# Patient Record
Sex: Male | Born: 1955
Health system: Southern US, Community
[De-identification: ages and names within clinical notes are randomized; demographics above are authoritative.]

## PROBLEM LIST (undated history)

## (undated) DIAGNOSIS — J45909 Unspecified asthma, uncomplicated: Secondary | ICD-10-CM

## (undated) DIAGNOSIS — S069X9A Unspecified intracranial injury with loss of consciousness of unspecified duration, initial encounter: Secondary | ICD-10-CM

## (undated) DIAGNOSIS — K759 Inflammatory liver disease, unspecified: Secondary | ICD-10-CM

## (undated) DIAGNOSIS — S42309A Unspecified fracture of shaft of humerus, unspecified arm, initial encounter for closed fracture: Secondary | ICD-10-CM

## (undated) DIAGNOSIS — M5136 Other intervertebral disc degeneration, lumbar region: Secondary | ICD-10-CM

## (undated) DIAGNOSIS — F1010 Alcohol abuse, uncomplicated: Secondary | ICD-10-CM

## (undated) DIAGNOSIS — M549 Dorsalgia, unspecified: Secondary | ICD-10-CM

## (undated) DIAGNOSIS — R519 Headache, unspecified: Secondary | ICD-10-CM

## (undated) DIAGNOSIS — I10 Essential (primary) hypertension: Secondary | ICD-10-CM

## (undated) DIAGNOSIS — R51 Headache: Secondary | ICD-10-CM

## (undated) HISTORY — PX: COLONOSCOPY: SHX174

---

## 2004-11-11 ENCOUNTER — Emergency Department (HOSPITAL_COMMUNITY): Admission: EM | Admit: 2004-11-11 | Discharge: 2004-11-11 | Payer: Self-pay | Admitting: Emergency Medicine

## 2004-12-25 ENCOUNTER — Emergency Department (HOSPITAL_COMMUNITY): Admission: EM | Admit: 2004-12-25 | Discharge: 2004-12-25 | Payer: Self-pay | Admitting: Emergency Medicine

## 2008-09-16 ENCOUNTER — Emergency Department (HOSPITAL_COMMUNITY): Admission: EM | Admit: 2008-09-16 | Discharge: 2008-09-16 | Payer: Self-pay | Admitting: Family Medicine

## 2009-09-06 DIAGNOSIS — S069XAA Unspecified intracranial injury with loss of consciousness status unknown, initial encounter: Secondary | ICD-10-CM

## 2009-09-06 DIAGNOSIS — S069X9A Unspecified intracranial injury with loss of consciousness of unspecified duration, initial encounter: Secondary | ICD-10-CM

## 2009-09-06 HISTORY — DX: Unspecified intracranial injury with loss of consciousness of unspecified duration, initial encounter: S06.9X9A

## 2009-09-06 HISTORY — DX: Unspecified intracranial injury with loss of consciousness status unknown, initial encounter: S06.9XAA

## 2010-04-22 ENCOUNTER — Encounter: Payer: Self-pay | Admitting: Cardiology

## 2010-05-17 ENCOUNTER — Inpatient Hospital Stay (HOSPITAL_COMMUNITY): Admission: EM | Admit: 2010-05-17 | Discharge: 2010-05-19 | Payer: Self-pay | Admitting: Emergency Medicine

## 2010-05-20 ENCOUNTER — Emergency Department (HOSPITAL_COMMUNITY)
Admission: EM | Admit: 2010-05-20 | Discharge: 2010-05-20 | Payer: Self-pay | Source: Home / Self Care | Admitting: Emergency Medicine

## 2010-05-28 ENCOUNTER — Encounter: Payer: Self-pay | Admitting: General Surgery

## 2010-05-28 ENCOUNTER — Emergency Department (HOSPITAL_COMMUNITY): Admission: EM | Admit: 2010-05-28 | Discharge: 2010-05-28 | Payer: Self-pay | Admitting: Emergency Medicine

## 2010-06-01 ENCOUNTER — Emergency Department (HOSPITAL_COMMUNITY): Admission: EM | Admit: 2010-06-01 | Discharge: 2010-06-01 | Payer: Self-pay | Admitting: Emergency Medicine

## 2010-10-06 NOTE — Letter (Signed)
Summary: [facility]  [facility]   Imported By: Marylou Mccoy 07/15/2010 14:57:07  _____________________________________________________________________  External Attachment:    Type:   Image     Comment:   External Document

## 2010-11-19 LAB — URINE MICROSCOPIC-ADD ON

## 2010-11-19 LAB — URINALYSIS, ROUTINE W REFLEX MICROSCOPIC
Bilirubin Urine: NEGATIVE
Glucose, UA: NEGATIVE mg/dL
Ketones, ur: NEGATIVE mg/dL
Leukocytes, UA: NEGATIVE
Nitrite: NEGATIVE
Specific Gravity, Urine: 1.015 (ref 1.005–1.030)
pH: 5.5 (ref 5.0–8.0)

## 2010-11-19 LAB — GLUCOSE, CAPILLARY: Glucose-Capillary: 153 mg/dL — ABNORMAL HIGH (ref 70–99)

## 2010-11-19 LAB — COMPREHENSIVE METABOLIC PANEL
ALT: 74 U/L — ABNORMAL HIGH (ref 0–53)
AST: 82 U/L — ABNORMAL HIGH (ref 0–37)
Alkaline Phosphatase: 58 U/L (ref 39–117)
CO2: 18 mEq/L — ABNORMAL LOW (ref 19–32)
GFR calc Af Amer: 56 mL/min — ABNORMAL LOW (ref >60)
GFR calc non Af Amer: 46 mL/min — ABNORMAL LOW (ref >60)
Glucose, Bld: 108 mg/dL — ABNORMAL HIGH (ref 70–99)
Potassium: 4.2 mEq/L (ref 3.5–5.1)
Sodium: 138 mEq/L (ref 135–145)

## 2010-11-19 LAB — BASIC METABOLIC PANEL
Calcium: 9.3 mg/dL (ref 8.4–10.5)
GFR calc Af Amer: >60 mL/min (ref >60)
GFR calc non Af Amer: 59 mL/min — ABNORMAL LOW (ref >60)
Glucose, Bld: 132 mg/dL — ABNORMAL HIGH (ref 70–99)
Potassium: 3.9 mEq/L (ref 3.5–5.1)
Sodium: 132 mEq/L — ABNORMAL LOW (ref 135–145)

## 2010-11-19 LAB — MRSA PCR SCREENING: MRSA by PCR: NEGATIVE

## 2010-11-19 LAB — CBC
HCT: 41.6 % (ref 39.0–52.0)
HCT: 41.8 % (ref 39.0–52.0)
Hemoglobin: 14.1 g/dL (ref 13.0–17.0)
Hemoglobin: 14.3 g/dL (ref 13.0–17.0)
MCHC: 34.2 g/dL (ref 30.0–36.0)
RBC: 5.52 MIL/uL (ref 4.22–5.81)
RBC: 5.53 MIL/uL (ref 4.22–5.81)
WBC: 8.7 10*3/uL (ref 4.0–10.5)
WBC: 9.6 10*3/uL (ref 4.0–10.5)

## 2010-11-19 LAB — POCT I-STAT, CHEM 8
BUN: 20 mg/dL (ref 6–23)
Calcium, Ion: 1.09 mmol/L — ABNORMAL LOW (ref 1.12–1.32)
Chloride: 108 mEq/L (ref 96–112)
HCT: 48 % (ref 39.0–52.0)
Potassium: 4.2 mEq/L (ref 3.5–5.1)

## 2010-11-19 LAB — PROTIME-INR: Prothrombin Time: 12.4 seconds (ref 11.6–15.2)

## 2010-11-19 LAB — RAPID URINE DRUG SCREEN, HOSP PERFORMED
Amphetamines: NOT DETECTED
Opiates: POSITIVE — AB
Tetrahydrocannabinol: NOT DETECTED

## 2010-11-19 LAB — ABO/RH: ABO/RH(D): A POS

## 2015-10-12 ENCOUNTER — Encounter: Payer: Self-pay | Admitting: Emergency Medicine

## 2015-10-12 ENCOUNTER — Emergency Department
Admission: EM | Admit: 2015-10-12 | Discharge: 2015-10-13 | Disposition: A | Discharge status: Home / Self Care | Payer: Medicaid Other | Attending: Emergency Medicine | Admitting: Emergency Medicine

## 2015-10-12 DIAGNOSIS — F1410 Cocaine abuse, uncomplicated: Secondary | ICD-10-CM | POA: Insufficient documentation

## 2015-10-12 DIAGNOSIS — M79661 Pain in right lower leg: Secondary | ICD-10-CM | POA: Diagnosis not present

## 2015-10-12 DIAGNOSIS — F1020 Alcohol dependence, uncomplicated: Secondary | ICD-10-CM | POA: Insufficient documentation

## 2015-10-12 DIAGNOSIS — I10 Essential (primary) hypertension: Secondary | ICD-10-CM | POA: Insufficient documentation

## 2015-10-12 DIAGNOSIS — F17200 Nicotine dependence, unspecified, uncomplicated: Secondary | ICD-10-CM | POA: Diagnosis not present

## 2015-10-12 DIAGNOSIS — M79662 Pain in left lower leg: Secondary | ICD-10-CM | POA: Insufficient documentation

## 2015-10-12 DIAGNOSIS — G8929 Other chronic pain: Secondary | ICD-10-CM | POA: Diagnosis not present

## 2015-10-12 DIAGNOSIS — Z008 Encounter for other general examination: Secondary | ICD-10-CM | POA: Diagnosis present

## 2015-10-12 HISTORY — DX: Essential (primary) hypertension: I10

## 2015-10-12 HISTORY — DX: Inflammatory liver disease, unspecified: K75.9

## 2015-10-12 LAB — ETHANOL: Alcohol, Ethyl (B): <5 mg/dL (ref <5)

## 2015-10-12 LAB — BASIC METABOLIC PANEL
ANION GAP: 7 (ref 5–15)
BUN: 27 mg/dL — ABNORMAL HIGH (ref 6–20)
CALCIUM: 8.7 mg/dL — AB (ref 8.9–10.3)
CHLORIDE: 107 mmol/L (ref 101–111)
CO2: 23 mmol/L (ref 22–32)
Creatinine, Ser: 1.75 mg/dL — ABNORMAL HIGH (ref 0.61–1.24)
GFR calc non Af Amer: 41 mL/min — ABNORMAL LOW (ref >60)
GFR, EST AFRICAN AMERICAN: 48 mL/min — AB (ref >60)
GLUCOSE: 93 mg/dL (ref 65–99)
POTASSIUM: 4.2 mmol/L (ref 3.5–5.1)
Sodium: 137 mmol/L (ref 135–145)

## 2015-10-12 LAB — CBC
HEMATOCRIT: 47.3 % (ref 40.0–52.0)
HEMOGLOBIN: 15 g/dL (ref 13.0–18.0)
MCH: 24.2 pg — ABNORMAL LOW (ref 26.0–34.0)
MCHC: 31.7 g/dL — AB (ref 32.0–36.0)
MCV: 76.4 fL — ABNORMAL LOW (ref 80.0–100.0)
Platelets: 248 10*3/uL (ref 150–440)
RBC: 6.18 MIL/uL — AB (ref 4.40–5.90)
RDW: 14.7 % — ABNORMAL HIGH (ref 11.5–14.5)
WBC: 7.6 10*3/uL (ref 3.8–10.6)

## 2015-10-12 LAB — COMPREHENSIVE METABOLIC PANEL
ALBUMIN: 4.1 g/dL (ref 3.5–5.0)
ALK PHOS: 61 U/L (ref 38–126)
ALT: 60 U/L (ref 17–63)
ANION GAP: 8 (ref 5–15)
AST: 76 U/L — ABNORMAL HIGH (ref 15–41)
BILIRUBIN TOTAL: 1 mg/dL (ref 0.3–1.2)
BUN: 26 mg/dL — ABNORMAL HIGH (ref 6–20)
CALCIUM: 9.7 mg/dL (ref 8.9–10.3)
CO2: 25 mmol/L (ref 22–32)
Chloride: 102 mmol/L (ref 101–111)
Creatinine, Ser: 1.93 mg/dL — ABNORMAL HIGH (ref 0.61–1.24)
GFR calc non Af Amer: 37 mL/min — ABNORMAL LOW (ref >60)
GFR, EST AFRICAN AMERICAN: 42 mL/min — AB (ref >60)
GLUCOSE: 146 mg/dL — AB (ref 65–99)
POTASSIUM: 4 mmol/L (ref 3.5–5.1)
SODIUM: 135 mmol/L (ref 135–145)
TOTAL PROTEIN: 8.1 g/dL (ref 6.5–8.1)

## 2015-10-12 LAB — URINE DRUG SCREEN, QUALITATIVE (ARMC ONLY)
AMPHETAMINES, UR SCREEN: NOT DETECTED
BARBITURATES, UR SCREEN: NOT DETECTED
Benzodiazepine, Ur Scrn: NOT DETECTED
COCAINE METABOLITE, UR ~~LOC~~: POSITIVE — AB
Cannabinoid 50 Ng, Ur ~~LOC~~: NOT DETECTED
MDMA (ECSTASY) UR SCREEN: NOT DETECTED
METHADONE SCREEN, URINE: NOT DETECTED
OPIATE, UR SCREEN: NOT DETECTED
Phencyclidine (PCP) Ur S: NOT DETECTED
TRICYCLIC, UR SCREEN: NOT DETECTED

## 2015-10-12 MED ORDER — SODIUM CHLORIDE 0.9 % IV SOLN
Freq: Once | INTRAVENOUS | Status: AC
  Administered 2015-10-12: 1000 mL via INTRAVENOUS

## 2015-10-12 NOTE — ED Notes (Signed)
Pt given water to help facilitate getting a urine sample. Pt last admitted for detox thru [facility] 6-7 years ago.

## 2015-10-12 NOTE — ED Notes (Signed)
Pt waiting for [facility] transport; calm and cooperative

## 2015-10-12 NOTE — [facility] Counselor (Signed)
This Clinical research associate faxed pt's referral information to [facility] for potential placement. Awaiting for approval from [facility].

## 2015-10-12 NOTE — ED Notes (Signed)
Labs drawn and sent -

## 2015-10-12 NOTE — ED Notes (Signed)
First liter of IVF complete; pt up to BR with steady gait to void;

## 2015-10-12 NOTE — ED Provider Notes (Signed)
Patient waiting for bed for [facility] patient now has a bed in the meantime is given the patient 2 L of fluid in attempt to worsening renal function because I thought perhaps it was due to dehydration his renal function has improved somewhat. [facility] now has a bed I will discharge and [facility] is planned  Arnaldo Natal, MD 10/12/15 2219

## 2015-10-12 NOTE — ED Notes (Signed)
Explained to pt we are still waiting to see if [facility] has accepted him. Pt watching tv with no complaints

## 2015-10-12 NOTE — Discharge Instructions (Signed)
Alcohol Use Disorder °Alcohol use disorder is a mental disorder. It is not a one-time incident of heavy drinking. Alcohol use disorder is the excessive and uncontrollable use of alcohol over time that leads to problems with functioning in one or more areas of daily living. People with this disorder risk harming themselves and others when they drink to excess. Alcohol use disorder also can cause other mental disorders, such as mood and anxiety disorders, and serious physical problems. People with alcohol use disorder often misuse other drugs.  °Alcohol use disorder is common and widespread. Some people with this disorder drink alcohol to cope with or escape from negative life events. Others drink to relieve chronic pain or symptoms of mental illness. People with a family history of alcohol use disorder are at higher risk of losing control and using alcohol to excess.  °Drinking too much alcohol can cause injury, accidents, and health problems. One drink can be too much when you are: °· Working. °· Pregnant or breastfeeding. °· Taking medicines. Ask your doctor. °· Driving or planning to drive. °SYMPTOMS  °Signs and symptoms of alcohol use disorder may include the following:  °· Consumption of alcohol in larger amounts or over a longer period of time than intended. °· Multiple unsuccessful attempts to cut down or control alcohol use.   °· A great deal of time spent obtaining alcohol, using alcohol, or recovering from the effects of alcohol (hangover). °· A strong desire or urge to use alcohol (cravings).   °· Continued use of alcohol despite problems at work, school, or home because of alcohol use.   °· Continued use of alcohol despite problems in relationships because of alcohol use. °· Continued use of alcohol in situations when it is physically hazardous, such as driving a car. °· Continued use of alcohol despite awareness of a physical or psychological problem that is likely related to alcohol use. Physical  problems related to alcohol use can involve the brain, heart, liver, stomach, and intestines. Psychological problems related to alcohol use include intoxication, depression, anxiety, psychosis, delirium, and dementia.   °· The need for increased amounts of alcohol to achieve the same desired effect, or a decreased effect from the consumption of the same amount of alcohol (tolerance). °· Withdrawal symptoms upon reducing or stopping alcohol use, or alcohol use to reduce or avoid withdrawal symptoms. Withdrawal symptoms include: °¨ Racing heart. °¨ Hand tremor. °¨ Difficulty sleeping. °¨ Nausea. °¨ Vomiting. °¨ Hallucinations. °¨ Restlessness. °¨ Seizures. °DIAGNOSIS °Alcohol use disorder is diagnosed through an assessment by your health care provider. Your health care provider may start by asking three or four questions to screen for excessive or problematic alcohol use. To confirm a diagnosis of alcohol use disorder, at least two symptoms must be present within a 12-month period. The severity of alcohol use disorder depends on the number of symptoms: °· Mild--two or three. °· Moderate--four or five. °· Severe--six or more. °Your health care provider may perform a physical exam or use results from lab tests to see if you have physical problems resulting from alcohol use. Your health care provider may refer you to a mental health professional for evaluation. °TREATMENT  °Some people with alcohol use disorder are able to reduce their alcohol use to low-risk levels. Some people with alcohol use disorder need to quit drinking alcohol. When necessary, mental health professionals with specialized training in substance use treatment can help. Your health care provider can help you decide how severe your alcohol use disorder is and what type of treatment you need.   The following forms of treatment are available:   Detoxification. Detoxification involves the use of prescription medicines to prevent alcohol withdrawal  symptoms in the first week after quitting. This is important for people with a history of symptoms of withdrawal and for heavy drinkers who are likely to have withdrawal symptoms. Alcohol withdrawal can be dangerous and, in severe cases, cause death. Detoxification is usually provided in a hospital or in-patient substance use treatment facility.  Counseling or talk therapy. Talk therapy is provided by substance use treatment counselors. It addresses the reasons people use alcohol and ways to keep them from drinking again. The goals of talk therapy are to help people with alcohol use disorder find healthy activities and ways to cope with life stress, to identify and avoid triggers for alcohol use, and to handle cravings, which can cause relapse.  Medicines.Different medicines can help treat alcohol use disorder through the following actions:  Decrease alcohol cravings.  Decrease the positive reward response felt from alcohol use.  Produce an uncomfortable physical reaction when alcohol is used (aversion therapy).  Support groups. Support groups are run by people who have quit drinking. They provide emotional support, advice, and guidance. These forms of treatment are often combined. Some people with alcohol use disorder benefit from intensive combination treatment provided by specialized substance use treatment centers. Both inpatient and outpatient treatment programs are available.   This information is not intended to replace advice given to you by your health care provider. Make sure you discuss any questions you have with your health care provider.   Document Released: 09/30/2004 Document Revised: 09/13/2014 Document Reviewed: 11/30/2012 Elsevier Interactive Patient Education 2016 [employer].  Finding Treatment for Addiction WHAT IS ADDICTION? Addiction is a complex disease of the brain. It causes an uncontrollable (compulsive) need for a substance. You can be addicted to alcohol,  illegal drugs, or prescription medicines such as painkillers. Addiction can also be a behavior, like gambling or shopping. The need for the drug or activity can become so strong that you think about it all the time. You can also become physically dependent on a substance. Addiction can change the way your brain works. Because of these changes, getting more of whatever you are addicted to becomes the most important thing to you and feels better than other activities or relationships. Addiction can lead to changes in health, behavior, emotions, relationships, and choices that affect you and everyone around you. HOW DO I KNOW IF I NEED TREATMENT FOR ADDICTION? Addiction is a progressive disease. Without treatment, addiction can get worse. Living with addiction puts you at higher risk for injury, poor health, lost employment, loss of money, and even death. You might need treatment for addiction if:  You have tried to stop or cut down, but you cannot.  Your addiction is causing physical health problems.  You find it annoying that your friends and family are concerned about your alcohol or substance use.  You feel guilty about substance abuse or a compulsive behavior.  You have lied or tried to hide your addiction.  You need a particular substance or activity to start your day or to calm down.  You are getting in trouble at school, work, home, or with the police.  You have done something illegal to support your addiction.  You are running out of money because of your addiction.  You have no time for anything other than your addiction. WHAT TYPES OF TREATMENT ARE AVAILABLE? The treatment program that is right for you  will depend on many factors, including the type of addiction you have. Treatment programs can be outpatient or inpatient. In an outpatient program, you live at home and go to work or school, but you also go to a clinic for treatment. With an inpatient program, you live and sleep at  the program facility during treatment. After treatment, you might need a plan for support during recovery. Other treatment options include:   Medicine.  Some addictions may be treated with prescription medicines.  You might also need medicine to treat anxiety or depression.  Counseling and behavior therapy. Therapy can help individuals and families behave in healthier ways and relate more effectively.  Support groups. Confidential group therapy, such as a 12-step program, can help individuals and families during treatment and recovery. No single type of program is right for everyone. Many treatment programs involve a combination of education, counseling, and a 12-step, spiritually-based approach. Some treatment programs are government sponsored. They are geared for patients who do not have private insurance. Treatment programs can vary in many respects, such as:  Cost and types of insurance that are accepted.  Types of on-site medical services that are offered.  Length of stay, setting, and size.  Overall philosophy of treatment. WHAT SHOULD I CONSIDER WHEN SELECTING A TREATMENT PROGRAM? It is important to think about your individual requirements when selecting a treatment program. There are a number of things to consider, such as:  If the program is certified by the appropriate government agency. Even private programs must be certified and employ certified professionals.  If the program is covered by your insurance. If finances are a concern, the first call you should make is to your insurance company, if you have health insurance. Ask for a list of treatment programs that are in your network, and confirm any copayments and deductibles that you may have to pay.  If you do not have insurance, or if you choose to attend a program that does not accept your insurance, discuss whether a payment plan can be set up.  If treatment is available in languages other than English, if needed.  If  the program offers detoxification treatment, if needed.  If 12-step meetings are held at the center or if transport is available for patients to attend meetings at other locations.  If the program is professional, organized, and clean.  If the program meets all of your needs, including physical and cultural needs.  If the facility offers specific treatment for your particular addiction.  If support continues to be offered after you have left the program.  If your treatment plan is continually looked at to make sure you are receiving the right treatment at the right time.  If mental health counseling is part of your treatment.  If medicine is included in treatment, if needed.  If your family is included in your treatment plan and if support is offered to them throughout the treatment process.  How the treatment works to prevent relapse. WHERE ELSE CAN I GET HELP?  Your health care provider. Ask him or her to help you find addiction treatment. These discussions are confidential.  The [employer] on Alcoholism and Drug Dependence (NCADD). This group has information about treatment centers and programs for people who have an addiction and for family members.  The telephone number is 1-800-NCA-CALL ((281)445-8389).  The website is https://ncadd.org/about-ncadd/our-affiliates  The Substance Abuse and Mental Health Services Administration [facility]). This group will help you find publicly funded treatment centers, help hotlines,  and counseling services near you.  The telephone number is 1-800-662-HELP (234-705-7634).  The website is www.findtreatment.RockT[employer].pl In countries outside of the [place]. and [place], look in [employer] for contact information for services in your area.   This information is not intended to replace advice given to you by your health care provider. Make sure you discuss any questions you have with your health care provider.   Document Released:  07/22/2005 Document Revised: 05/14/2015 Document Reviewed: 06/11/2014 Elsevier Interactive Patient Education 2016 Elsevier Inc.  Stimulant Use Disorder-Cocaine Cocaine is one of a group of powerful drugs called stimulants. Cocaine has medical uses for stopping nosebleeds and for pain control before minor nose or dental surgery. However, cocaine is misused because of the effects that it produces. These effects include:   A feeling of extreme pleasure.  Alertness.  High energy. Common street names for cocaine include coke, crack, blow, snow, and nose candy. Cocaine is snorted, dissolved in water and injected, or smoked.  Stimulants are addictive because they activate regions of the brain that produce both the pleasurable sensation of "reward" and psychological dependence. Together, these actions account for loss of control and the rapid development of drug dependence. This means you become ill without the drug (withdrawal) and need to keep using it to function.  Stimulant use disorder is use of stimulants that disrupts your daily life. It disrupts relationships with family and friends and how you do your job. Cocaine increases your blood pressure and heart rate. It can cause a heart attack or stroke. Cocaine can also cause death from irregular heart rate or seizures. SYMPTOMS Symptoms of stimulant use disorder with cocaine include:  Use of cocaine in larger amounts or over a longer period of time than intended.  Unsuccessful attempts to cut down or control cocaine use.  A lot of time spent obtaining, using, or recovering from the effects of cocaine.  A strong desire or urge to use cocaine (craving).  Continued use of cocaine in spite of major problems at work, school, or home because of use.  Continued use of cocaine in spite of relationship problems because of use.  Giving up or cutting down on important life activities because of cocaine use.  Use of cocaine over and over in  situations when it is physically hazardous, such as driving a car.  Continued use of cocaine in spite of a physical problem that is likely related to use. Physical problems can include:  Malnutrition.  Nosebleeds.  Chest pain.  High blood pressure.  A hole that develops between the part of your nose that separates your nostrils (perforated nasal septum).  Lung and kidney damage.  Continued use of cocaine in spite of a mental problem that is likely related to use. Mental problems can include:  Schizophrenia-like symptoms.  Depression.  Bipolar mood swings.  Anxiety.  Sleep problems.  Need to use more and more cocaine to get the same effect, or lessened effect over time with use of the same amount of cocaine (tolerance).  Having withdrawal symptoms when cocaine use is stopped, or using cocaine to reduce or avoid withdrawal symptoms. Withdrawal symptoms include:  Depressed or irritable mood.  Low energy or restlessness.  Bad dreams.  Poor or excessive sleep.  Increased appetite. DIAGNOSIS Stimulant use disorder is diagnosed by your health care provider. You may be asked questions about your cocaine use and how it affects your life. A physical exam may be done. A drug screen may be ordered. You may be  referred to a mental health professional. The diagnosis of stimulant use disorder requires at least two symptoms within 12 months. The type of stimulant use disorder depends on the number of signs and symptoms you have. The type may be:  Mild. Two or three signs and symptoms.  Moderate. Four or five signs and symptoms.  Severe. Six or more signs and symptoms. TREATMENT Treatment for stimulant use disorder is usually provided by mental health professionals with training in substance use disorders. The following options are available:  Counseling or talk therapy. Talk therapy addresses the reasons you use cocaine and ways to keep you from using again. Goals of talk therapy  include:  Identifying and avoiding triggers for use.  Handling cravings.  Replacing use with healthy activities.  Support groups. Support groups provide emotional support, advice, and guidance.  Medicine. Certain medicines may decrease cocaine cravings or withdrawal symptoms. HOME CARE INSTRUCTIONS  Take medicines only as directed by your health care provider.  Identify the people and activities that trigger your cocaine use and avoid them.  Keep all follow-up visits as directed by your health care provider. SEEK MEDICAL CARE IF:  Your symptoms get worse or you relapse.  You are not able to take medicines as directed. SEEK IMMEDIATE MEDICAL CARE IF:  You have serious thoughts about hurting yourself or others.  You have a seizure, chest pain, sudden weakness, or loss of speech or vision. FOR MORE INFORMATION  National Institute on Drug Abuse: http://www.price-smith.com/  Substance Abuse and Mental Health Services Administration: Skate[employer]asis.com.pt   This information is not intended to replace advice given to you by your health care provider. Make sure you discuss any questions you have with your health care provider.   Document Released: 08/20/2000 Document Revised: 09/13/2014 Document Reviewed: 09/05/2013 Elsevier Interactive Patient Education [employer]

## 2015-10-12 NOTE — ED Notes (Signed)
Pt presents to triage ambulatory reports he wants DETOX from cocaine, alcohol. Last time cocaine consumption this morning, alcohol last drink this morning around 09:30am. Pt reports he is homeless and wants Rehab.

## 2015-10-12 NOTE — ED Provider Notes (Signed)
[facility] Emergency Department Provider Note  ____________________________________________  Time seen: 2:25 PM  I have reviewed the triage vital signs and the nursing notes.   HISTORY  Chief Complaint Medical Clearance    HPI Brian Kelley is a 60 y.o. male who requests detox and rehabilitation placement for alcohol and crack cocaine abuse. He last was in detox about 6 or 7 years ago at [facility]. Last use of alcohol was today. Reports getting shaky when he doesn't drink but no history of seizures or hallucinations. Last crack cocaine use was also today this morning. Denies any chest pain shortness of breath headache vision changes or other neuro or vascular symptoms.     Past Medical History  Diagnosis Date  . Hypertension   . Hepatitis      There are no active problems to display for this patient.    History reviewed. No pertinent past surgical history.   No current outpatient prescriptions on file.   Allergies Peanuts   No family history on file.  Social History Social History  Substance Use Topics  . Smoking status: Current Every Day Smoker -- 1.00 packs/day  . Smokeless tobacco: None  . Alcohol Use: Yes     Comment: 1/4    Review of Systems  Constitutional:   No fever or chills. No weight changes Eyes:   No blurry vision or double vision.  ENT:   No sore throat. Cardiovascular:   No chest pain. Respiratory:   No dyspnea or cough. Gastrointestinal:   Negative for abdominal pain, vomiting and diarrhea.  No BRBPR or melena. Genitourinary:   Negative for dysuria, urinary retention, bloody urine, or difficulty urinating. Musculoskeletal:   Negative for back pain. Chronic bilateral lower leg pain Skin:   Negative for rash. Neurological:   Negative for headaches, focal weakness or numbness. Psychiatric:  No anxiety or depression.   Endocrine:  No hot/cold intolerance, changes in energy, or sleep difficulty.  10-point ROS  otherwise negative.  ____________________________________________   PHYSICAL EXAM:  VITAL SIGNS: ED Triage Vitals  Enc Vitals Group     BP 10/12/15 1401 121/82 mmHg     Pulse Rate 10/12/15 1401 95     Resp --      Temp 10/12/15 1401 98.2 F (36.8 C)     Temp Source 10/12/15 1401 Oral     SpO2 10/12/15 1401 96 %     Weight 10/12/15 1401 160 lb (72.576 kg)     Height 10/12/15 1401  (1.626 m)     Head Cir --      Peak Flow --      Pain Score 10/12/15 1403 8     Pain Loc --      Pain Edu? --      Excl. in GC? --     Vital signs reviewed, nursing assessments reviewed.   Constitutional:   Alert and oriented. Well appearing and in no distress. Eyes:   No scleral icterus. No conjunctival pallor. PERRL. EOMI ENT   Head:   Normocephalic and atraumatic.   Nose:   No congestion/rhinnorhea. No septal hematoma   Mouth/Throat:   MMM, no pharyngeal erythema. No peritonsillar mass. No uvula shift.   Neck:   No stridor. No SubQ emphysema. No meningismus. Hematological/Lymphatic/Immunilogical:   No cervical lymphadenopathy. Cardiovascular:   RRR. Normal and symmetric distal pulses are present in all extremities. No murmurs, rubs, or gallops. Respiratory:   Normal respiratory effort without tachypnea nor retractions. Breath  sounds are clear and equal bilaterally. No wheezes/rales/rhonchi. Gastrointestinal:   Soft and nontender. No distention. There is no CVA tenderness.  No rebound, rigidity, or guarding. Genitourinary:   deferred Musculoskeletal:   Nontender with normal range of motion in all extremities. No joint effusions.  No lower extremity tenderness.  No edema. Neurologic:   Normal speech and language.  CN 2-10 normal. Motor grossly intact. No pronator drift.  Normal gait. No gross focal neurologic deficits are appreciated.  Skin:    Skin is warm, dry and intact. No rash noted.  No petechiae, purpura, or bullae. Psychiatric:   Mood and affect are normal. Speech  and behavior are normal. Patient exhibits appropriate insight and judgment.  ____________________________________________    LABS (pertinent positives/negatives) (all labs ordered are listed, but only abnormal results are displayed) Labs Reviewed  COMPREHENSIVE METABOLIC PANEL - Abnormal; Notable for the following:    Glucose, Bld 146 (*)    BUN 26 (*)    Creatinine, Ser 1.93 (*)    AST 76 (*)    GFR calc non Af Amer 37 (*)    GFR calc Af Amer 42 (*)    All other components within normal limits  CBC - Abnormal; Notable for the following:    RBC 6.18 (*)    MCV 76.4 (*)    MCH 24.2 (*)    MCHC 31.7 (*)    RDW 14.7 (*)    All other components within normal limits  ETHANOL  URINE DRUG SCREEN, QUALITATIVE ([facility] ONLY)   ____________________________________________   EKG    ____________________________________________    RADIOLOGY    ____________________________________________   PROCEDURES   ____________________________________________   INITIAL IMPRESSION / ASSESSMENT AND PLAN / ED COURSE  Pertinent labs & imaging results that were available during my care of the patient were reviewed by me and considered in my medical decision making (see chart for details).  Patient is well-appearing no acute distress, at apparently baseline, medically stable and cleared to proceed with rehabilitation placement. The patient does not appear to qualify for medically supervised with detox program at this time, so we'll refer him to [facility] which he can make contact with.  He is otherwise stable and suitable for discharge. No SI HI or hallucinations.     ____________________________________________   FINAL CLINICAL IMPRESSION(S) / ED DIAGNOSES  Final diagnoses:  Uncomplicated alcohol dependence (HCC)  Cocaine abuse      Sharman Cheek, MD 10/12/15 [phone]

## 2015-10-12 NOTE — ED Notes (Signed)
Pt resting in bed watching tv; understands waiting for call back from [employer] regarding acceptance or denial; no complaints or requests

## 2015-10-13 NOTE — ED Notes (Signed)
Pt waiting patiently for [facility] transport; spoke with Deanna Artis, TTS who says she was told transport would arrive around midnight; pt aware;

## 2015-11-24 ENCOUNTER — Emergency Department
Admission: EM | Admit: 2015-11-24 | Discharge: 2015-11-24 | Disposition: A | Discharge status: Home / Self Care | Payer: Medicaid Other | Attending: Emergency Medicine | Admitting: Emergency Medicine

## 2015-11-24 ENCOUNTER — Encounter: Payer: Self-pay | Admitting: Emergency Medicine

## 2015-11-24 DIAGNOSIS — G8929 Other chronic pain: Secondary | ICD-10-CM

## 2015-11-24 DIAGNOSIS — F17200 Nicotine dependence, unspecified, uncomplicated: Secondary | ICD-10-CM | POA: Insufficient documentation

## 2015-11-24 DIAGNOSIS — M545 Low back pain: Secondary | ICD-10-CM | POA: Insufficient documentation

## 2015-11-24 DIAGNOSIS — I10 Essential (primary) hypertension: Secondary | ICD-10-CM | POA: Diagnosis not present

## 2015-11-24 DIAGNOSIS — M25562 Pain in left knee: Secondary | ICD-10-CM | POA: Diagnosis present

## 2015-11-24 NOTE — ED Notes (Addendum)
Pt discharged home after verbalizing understanding of discharge instructions; nad noted. 

## 2015-11-24 NOTE — ED Provider Notes (Signed)
[facility] Emergency Department Provider Note  ____________________________________________  Time seen: Approximately 11:02 AM  I have reviewed the triage vital signs and the nursing notes.   HISTORY  Chief Complaint Knee Pain    HPI Brian Kelley is a 60 y.o. male , NAD, presents to the emergency department with chronic lower back and left leg pain and burning. States he recently was approved for [facility] and [facility] and is looking for a primary care physician. He states that he has applied for disability 3 times and was turned down and just recently reapplied. He is here today to establish care so that he can continue with his disability application. Patient denies any changes in his pain. Has not had any numbness, weakness, tingling. Has not had any recent injuries, falls, traumas. Denies any swelling or redness. No skin sores. Had no loss of bowel or bladder function and no saddle paresthesias.   Past Medical History  Diagnosis Date  . Hypertension   . Hepatitis     There are no active problems to display for this patient.   History reviewed. No pertinent past surgical history.  No current outpatient prescriptions on file.  Allergies Peanuts  History reviewed. No pertinent family history.  Social History Social History  Substance Use Topics  . Smoking status: Current Every Day Smoker -- 1.00 packs/day  . Smokeless tobacco: None  . Alcohol Use: Yes     Comment: 1/4     Review of Systems  Constitutional: No fever/chills, sweats Cardiovascular: No chest pain. Respiratory: No cough. No shortness of breath. No wheezing.  Gastrointestinal: No abdominal pain.  No nausea, vomiting.   Musculoskeletal: Positive for back pain, left hip and leg pain.  Skin: Negative for rash him a redness, swelling, skin sores. Neurological: Negative for headaches, focal weakness or numbness. No tingling or saddle paresthesias. 10-point ROS otherwise  negative.  ____________________________________________   PHYSICAL EXAM:  VITAL SIGNS: ED Triage Vitals  Enc Vitals Group     BP 11/24/15 1037 148/106 mmHg     Pulse Rate 11/24/15 1037 89     Resp 11/24/15 1037 19     Temp 11/24/15 1037 98.6 F (37 C)     Temp Source 11/24/15 1037 Oral     SpO2 11/24/15 1037 98 %     Weight 11/24/15 1037 180 lb (81.647 kg)     Height 11/24/15 1037  (1.753 m)     Head Cir --      Peak Flow --      Pain Score --      Pain Loc --      Pain Edu? --      Excl. in GC? --     Constitutional: Alert and oriented. Well appearing and in no acute distress. Eyes: Conjunctivae are normal. Head: Atraumatic.  Neck: Supple with FROM.  Hematological/Lymphatic/Immunilogical: No cervical lymphadenopathy. Cardiovascular:  Good peripheral circulation. Respiratory: Normal respiratory effort without tachypnea or retractions.  Musculoskeletal: No lower extremity tenderness nor edema.  No joint effusions. Neurologic:  Normal speech and language. No gross focal neurologic deficits are appreciated.  Skin:  Skin is warm, dry and intact. No rash noted. Psychiatric: Mood and affect are normal. Speech and behavior are normal. Patient exhibits appropriate insight and judgement.   ____________________________________________   LABS  None  ____________________________________________  EKG  None ____________________________________________  RADIOLOGY  None ____________________________________________    PROCEDURES  Procedure(s) performed: None    Medications - No data to  display   ____________________________________________   INITIAL IMPRESSION / ASSESSMENT AND PLAN / ED COURSE  Patient's diagnosis is consistent with chronic pain. Patient only presented to the ED to gain referral to a new PCP in the area to establish care. Patient is to follow up with [facility] to establish care. Patient is given ED precautions to return to the  ED for any worsening or new symptoms.    ____________________________________________  FINAL CLINICAL IMPRESSION(S) / ED DIAGNOSES  Final diagnoses:  Chronic pain      NEW MEDICATIONS STARTED DURING THIS VISIT:  New Prescriptions   No medications on file         Hope PigeonJami L Hagler, PA-C 11/24/15 1125  Governor Rooksebecca Lord, MD 11/24/15 [phone]

## 2015-11-24 NOTE — ED Notes (Signed)
Reports left knee burning sensation x 4 months.  Ambulates well.

## 2015-11-24 NOTE — ED Notes (Signed)
States he is having some burning to left foot ankle area ..states pain radiates from lower back  Hx of fall about 4 months ago

## 2015-11-24 NOTE — Discharge Instructions (Signed)

## 2015-12-26 ENCOUNTER — Other Ambulatory Visit: Payer: Self-pay | Admitting: Family Medicine

## 2015-12-26 DIAGNOSIS — R202 Paresthesia of skin: Secondary | ICD-10-CM

## 2016-01-23 ENCOUNTER — Ambulatory Visit: Admission: RE | Admit: 2016-01-23 | Payer: Medicaid Other | Source: Ambulatory Visit

## 2016-02-13 ENCOUNTER — Ambulatory Visit
Admission: RE | Admit: 2016-02-13 | Discharge: 2016-02-13 | Disposition: A | Discharge status: Home / Self Care | Payer: Medicaid Other | Source: Ambulatory Visit | Attending: Family Medicine | Admitting: Family Medicine

## 2016-02-13 DIAGNOSIS — M4806 Spinal stenosis, lumbar region: Secondary | ICD-10-CM | POA: Insufficient documentation

## 2016-02-13 DIAGNOSIS — R202 Paresthesia of skin: Secondary | ICD-10-CM | POA: Insufficient documentation

## 2016-02-13 DIAGNOSIS — M5126 Other intervertebral disc displacement, lumbar region: Secondary | ICD-10-CM | POA: Insufficient documentation

## 2016-02-13 DIAGNOSIS — M4807 Spinal stenosis, lumbosacral region: Secondary | ICD-10-CM | POA: Insufficient documentation

## 2016-04-20 ENCOUNTER — Other Ambulatory Visit (HOSPITAL_COMMUNITY): Payer: Self-pay | Admitting: Neurosurgery

## 2016-04-20 DIAGNOSIS — G959 Disease of spinal cord, unspecified: Secondary | ICD-10-CM

## 2016-04-30 ENCOUNTER — Encounter: Payer: Self-pay | Admitting: Emergency Medicine

## 2016-04-30 ENCOUNTER — Emergency Department: Payer: Medicaid Other

## 2016-04-30 ENCOUNTER — Other Ambulatory Visit: Payer: Self-pay

## 2016-04-30 ENCOUNTER — Ambulatory Visit
Admission: RE | Admit: 2016-04-30 | Discharge: 2016-04-30 | Disposition: A | Discharge status: Home / Self Care | Payer: Medicaid Other | Source: Ambulatory Visit | Attending: Neurosurgery | Admitting: Neurosurgery

## 2016-04-30 ENCOUNTER — Emergency Department
Admission: EM | Admit: 2016-04-30 | Discharge: 2016-04-30 | Disposition: A | Discharge status: Home / Self Care | Payer: Medicaid Other | Attending: Emergency Medicine | Admitting: Emergency Medicine

## 2016-04-30 DIAGNOSIS — R531 Weakness: Secondary | ICD-10-CM

## 2016-04-30 DIAGNOSIS — G9589 Other specified diseases of spinal cord: Secondary | ICD-10-CM | POA: Insufficient documentation

## 2016-04-30 DIAGNOSIS — M4712 Other spondylosis with myelopathy, cervical region: Secondary | ICD-10-CM | POA: Diagnosis not present

## 2016-04-30 DIAGNOSIS — J45909 Unspecified asthma, uncomplicated: Secondary | ICD-10-CM | POA: Diagnosis not present

## 2016-04-30 DIAGNOSIS — R202 Paresthesia of skin: Secondary | ICD-10-CM

## 2016-04-30 DIAGNOSIS — G959 Disease of spinal cord, unspecified: Secondary | ICD-10-CM

## 2016-04-30 DIAGNOSIS — R200 Anesthesia of skin: Secondary | ICD-10-CM | POA: Diagnosis present

## 2016-04-30 DIAGNOSIS — I10 Essential (primary) hypertension: Secondary | ICD-10-CM | POA: Diagnosis not present

## 2016-04-30 DIAGNOSIS — F17200 Nicotine dependence, unspecified, uncomplicated: Secondary | ICD-10-CM | POA: Diagnosis not present

## 2016-04-30 HISTORY — DX: Unspecified asthma, uncomplicated: J45.909

## 2016-04-30 LAB — BASIC METABOLIC PANEL
ANION GAP: 9 (ref 5–15)
BUN: 18 mg/dL (ref 6–20)
CHLORIDE: 101 mmol/L (ref 101–111)
CO2: 26 mmol/L (ref 22–32)
Calcium: 9.9 mg/dL (ref 8.9–10.3)
Creatinine, Ser: 1.32 mg/dL — ABNORMAL HIGH (ref 0.61–1.24)
GFR calc non Af Amer: 58 mL/min — ABNORMAL LOW (ref >60)
Glucose, Bld: 83 mg/dL (ref 65–99)
POTASSIUM: 3.9 mmol/L (ref 3.5–5.1)
Sodium: 136 mmol/L (ref 135–145)

## 2016-04-30 LAB — CBC
HEMATOCRIT: 47.4 % (ref 40.0–52.0)
HEMOGLOBIN: 15.8 g/dL (ref 13.0–18.0)
MCH: 25.1 pg — AB (ref 26.0–34.0)
MCHC: 33.4 g/dL (ref 32.0–36.0)
MCV: 75.3 fL — AB (ref 80.0–100.0)
Platelets: 242 10*3/uL (ref 150–440)
RBC: 6.3 MIL/uL — AB (ref 4.40–5.90)
RDW: 14.5 % (ref 11.5–14.5)
WBC: 5.8 10*3/uL (ref 3.8–10.6)

## 2016-04-30 MED ORDER — TRAMADOL HCL 50 MG PO TABS
50.0000 mg | ORAL_TABLET | Freq: Once | ORAL | Status: AC
  Administered 2016-04-30: 50 mg via ORAL
  Filled 2016-04-30: qty 1

## 2016-04-30 NOTE — Discharge Instructions (Signed)
Follow-up with your neurosurgeon's as planned. Return as needed.

## 2016-04-30 NOTE — ED Notes (Signed)
E signature pad not working 
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
## 2016-04-30 NOTE — ED Notes (Signed)
Patient left for MRI.

## 2016-04-30 NOTE — ED Triage Notes (Signed)
Patient presents to the ED with increasing right sided weakness for several months.  Patient states he came to the hospital for an MRI this morning and post MRI decided he wanted to be seen in the ED today.  Patient states he has been having difficulty walking and reports back pain.  [facility] a [facility] and is being followed by neurology.

## 2016-04-30 NOTE — ED Notes (Signed)
Report given to Nellie RN 

## 2016-06-15 ENCOUNTER — Emergency Department
Admission: EM | Admit: 2016-06-15 | Discharge: 2016-06-15 | Disposition: A | Discharge status: Home / Self Care | Payer: Medicaid Other | Attending: Emergency Medicine | Admitting: Emergency Medicine

## 2016-06-15 ENCOUNTER — Encounter: Payer: Self-pay | Admitting: Emergency Medicine

## 2016-06-15 DIAGNOSIS — F17200 Nicotine dependence, unspecified, uncomplicated: Secondary | ICD-10-CM | POA: Diagnosis not present

## 2016-06-15 DIAGNOSIS — M5441 Lumbago with sciatica, right side: Secondary | ICD-10-CM | POA: Insufficient documentation

## 2016-06-15 DIAGNOSIS — Z91010 Allergy to peanuts: Secondary | ICD-10-CM | POA: Insufficient documentation

## 2016-06-15 DIAGNOSIS — F1010 Alcohol abuse, uncomplicated: Secondary | ICD-10-CM | POA: Diagnosis not present

## 2016-06-15 DIAGNOSIS — G8929 Other chronic pain: Secondary | ICD-10-CM | POA: Insufficient documentation

## 2016-06-15 DIAGNOSIS — I10 Essential (primary) hypertension: Secondary | ICD-10-CM | POA: Diagnosis not present

## 2016-06-15 DIAGNOSIS — M5442 Lumbago with sciatica, left side: Secondary | ICD-10-CM | POA: Insufficient documentation

## 2016-06-15 DIAGNOSIS — M549 Dorsalgia, unspecified: Secondary | ICD-10-CM | POA: Diagnosis present

## 2016-06-15 DIAGNOSIS — J45909 Unspecified asthma, uncomplicated: Secondary | ICD-10-CM | POA: Diagnosis not present

## 2016-06-15 HISTORY — DX: Alcohol abuse, uncomplicated: F10.10

## 2016-06-15 HISTORY — DX: Dorsalgia, unspecified: M54.9

## 2016-06-15 MED ORDER — OXYCODONE-ACETAMINOPHEN 5-325 MG PO TABS
1.0000 | ORAL_TABLET | Freq: Once | ORAL | Status: AC
Start: 2016-06-15 — End: 2016-06-15
  Administered 2016-06-15: 1 via ORAL
  Filled 2016-06-15: qty 1

## 2016-06-15 MED ORDER — OXYCODONE-ACETAMINOPHEN 5-325 MG PO TABS: 1.0000 | ORAL_TABLET | Freq: Four times a day (QID) | ORAL | 0 refills | Status: DC | PRN

## 2016-06-15 NOTE — Discharge Instructions (Signed)
If you have increased pain, numbness, weakness that is different from what you have been having, trouble or incontinence with bowels or bladder or you feel worse in any way return to the emergency department.

## 2016-06-15 NOTE — ED Provider Notes (Signed)
[facility] Emergency Department Provider Note  ____________________________________________   I have reviewed the triage vital signs and the nursing notes.   HISTORY  Chief Complaint Back Pain and Alcohol Problem    HPI Brian Kelley is a 60 y.o. male who presents today complaining of being out of his pain meds and also wanting rehabilitation from alcohol abuse. Patient is chronic back and neck pain. He has chronic numbness and weakness as a result. Has had outpatient MRI and ER MRI for this and does have disc disease. Denies any change in his neurologic condition. States he has baseline weakness and numbness there "for months". This is not different. What is different is he has run out of his pain medications and he would like to stop drinking or call. He is homeless. He denies any new or different pain or fever. Patient does have a neurosurgeon he is following up with.No progression of symptoms, no incontinence of bowel or bladder.    Past Medical History:  Diagnosis Date  . Alcohol abuse   . Asthma   . Back pain   . Hepatitis   . Hypertension     There are no active problems to display for this patient.   History reviewed. No pertinent surgical history.  Prior to Admission medications   Not on File    Allergies Peanuts [peanut oil]  No family history on file.  Social History Social History  Substance Use Topics  . Smoking status: Current Every Day Smoker    Packs/day: 1.00  . Smokeless tobacco: Never Used  . Alcohol use Yes     Comment: 1/4    Review of Systems Constitutional: No fever/chills Eyes: No visual changes. ENT: No sore throat. No stiff neck no neck pain Cardiovascular: Denies chest pain. Respiratory: Denies shortness of breath. Gastrointestinal:   no vomiting.  No diarrhea.  No constipation. Genitourinary: Negative for dysuria. Musculoskeletal: Negative lower extremity swelling Skin: Negative for rash. Neurological:  Negative for severe headaches, focal weakness or numbness. 10-point ROS otherwise negative.  ____________________________________________   PHYSICAL EXAM:  VITAL SIGNS: ED Triage Vitals  Enc Vitals Group     BP 06/15/16 1100 (!) 142/89     Pulse Rate 06/15/16 1100 68     Resp 06/15/16 1100 18     Temp 06/15/16 1100 98 F (36.7 C)     Temp Source 06/15/16 1100 Oral     SpO2 06/15/16 1100 100 %     Weight 06/15/16 1101 176 lb (79.8 kg)     Height 06/15/16 1101 5\' 9"  (1.753 m)     Head Circumference --      Peak Flow --      Pain Score 06/15/16 1111 0     Pain Loc --      Pain Edu? --      Excl. in GC? --     Constitutional: Alert and oriented. Well appearing and in no acute distress. Eyes: Conjunctivae are normal. PERRL. EOMI. Head: Atraumatic. Nose: No congestion/rhinnorhea. Mouth/Throat: Mucous membranes are moist.  Oropharynx non-erythematous. Neck: No stridor.   Nontender with no meningismus Cardiovascular: Normal rate, regular rhythm. Grossly normal heart sounds.  Good peripheral circulation. Respiratory: Normal respiratory effort.  No retractions. Lungs CTAB. Abdominal: Soft and nontender. No distention. No guarding no rebound Back:  There is no ulcers palpation of paraspinal region bilateral .  there is no midline tenderness there are no lesions noted. there is no CVA tenderness  Musculoskeletal: No lower  extremity tenderness, no upper extremity tenderness. No joint effusions, no DVT signs strong distal pulses no edema Neurologic:  Normal speech and language. No saddle anesthesia, sensation is diminished but symmetric in both lower shoulders, diminished reflexes are noted, it is symmetric but diminished bilaterally. Skin:  Skin is warm, dry and intact. No rash noted. Psychiatric: Mood and affect are normal. Speech and behavior are normal.  ____________________________________________   LABS (all labs ordered are listed, but only abnormal results are  displayed)  Labs Reviewed - No data to display ____________________________________________  EKG  I personally interpreted any EKGs ordered by me or triage ____________________________________________  RADIOLOGY  I reviewed any imaging ordered by me or triage that were performed during my shift and, if possible, patient and/or family made aware of any abnormal findings. ____________________________________________   PROCEDURES  Procedure(s) performed: None  Procedures  Critical Care performed: None  ____________________________________________   INITIAL IMPRESSION / ASSESSMENT AND PLAN / ED COURSE  Pertinent labs & imaging results that were available during my care of the patient were reviewed by me and considered in my medical decision making (see chart for details).  Patient with chronic numbness and weakness in his legs who has a outpatient follow-up with neurosurgery in the next few days, has had no change in his neurologic condition. Denies incontinence of bowel or bladder. States that he is not taking his medications and would like me to refill his prescriptions. He does have real disease, and I will give him a short protection for pain medication until he can see his neurosurgeon. At this time, there is no evidence of neurologic decompensation. Patient is in no acute distress. He is ambulated without became with no difficulty in the emergency room. He has no abdominal pain or anything to suggest referred abdominal pain. He declines further workup. TTS has seen and referred him to outpatient EtOH counseling. There is no inpatient facility available here. Patient is not in DTs. Extensive return precautions given and understood for any increase in neurologic symptoms or other complaints and he will be discharged at his request at this time.  Clinical Course   ____________________________________________   FINAL CLINICAL IMPRESSION(S) / ED DIAGNOSES  Final diagnoses:   None      This chart was dictated using voice recognition software.  Despite best efforts to proofread,  errors can occur which can change meaning.      Jeanmarie PlantJames A McShane, MD 06/15/16 1356

## 2016-06-15 NOTE — ED Triage Notes (Signed)
Patient reports with back pain, ran out of his pain medications the other day states he is taking as prescribed. Patient also here wanting alcohol detox. Last drink was this morning.

## 2016-06-16 ENCOUNTER — Other Ambulatory Visit: Payer: Self-pay | Admitting: Neurosurgery

## 2016-07-13 ENCOUNTER — Encounter (HOSPITAL_COMMUNITY)
Admission: RE | Admit: 2016-07-13 | Discharge: 2016-07-13 | Disposition: A | Discharge status: Home / Self Care | Payer: Medicaid Other | Source: Ambulatory Visit | Attending: Neurosurgery | Admitting: Neurosurgery

## 2016-07-13 ENCOUNTER — Encounter (HOSPITAL_COMMUNITY): Payer: Self-pay

## 2016-07-13 DIAGNOSIS — F1010 Alcohol abuse, uncomplicated: Secondary | ICD-10-CM | POA: Diagnosis not present

## 2016-07-13 DIAGNOSIS — M4802 Spinal stenosis, cervical region: Secondary | ICD-10-CM | POA: Insufficient documentation

## 2016-07-13 DIAGNOSIS — K759 Inflammatory liver disease, unspecified: Secondary | ICD-10-CM | POA: Diagnosis not present

## 2016-07-13 DIAGNOSIS — F17210 Nicotine dependence, cigarettes, uncomplicated: Secondary | ICD-10-CM | POA: Insufficient documentation

## 2016-07-13 DIAGNOSIS — I10 Essential (primary) hypertension: Secondary | ICD-10-CM | POA: Diagnosis not present

## 2016-07-13 DIAGNOSIS — Z01812 Encounter for preprocedural laboratory examination: Secondary | ICD-10-CM | POA: Diagnosis not present

## 2016-07-13 HISTORY — DX: Unspecified fracture of shaft of humerus, unspecified arm, initial encounter for closed fracture: S42.309A

## 2016-07-13 HISTORY — DX: Headache: R51

## 2016-07-13 HISTORY — DX: Headache, unspecified: R51.9

## 2016-07-13 LAB — COMPREHENSIVE METABOLIC PANEL
ALBUMIN: 4.4 g/dL (ref 3.5–5.0)
ALK PHOS: 52 U/L (ref 38–126)
ALT: 60 U/L (ref 17–63)
ANION GAP: 10 (ref 5–15)
AST: 53 U/L — ABNORMAL HIGH (ref 15–41)
BILIRUBIN TOTAL: 0.8 mg/dL (ref 0.3–1.2)
BUN: 13 mg/dL (ref 6–20)
CALCIUM: 10.3 mg/dL (ref 8.9–10.3)
CO2: 26 mmol/L (ref 22–32)
Chloride: 102 mmol/L (ref 101–111)
Creatinine, Ser: 1.33 mg/dL — ABNORMAL HIGH (ref 0.61–1.24)
GFR, EST NON AFRICAN AMERICAN: 57 mL/min — AB (ref >60)
GLUCOSE: 76 mg/dL (ref 65–99)
Potassium: 3.8 mmol/L (ref 3.5–5.1)
Sodium: 138 mmol/L (ref 135–145)
TOTAL PROTEIN: 9 g/dL — AB (ref 6.5–8.1)

## 2016-07-13 LAB — CBC
HCT: 47.2 % (ref 39.0–52.0)
HEMOGLOBIN: 15.9 g/dL (ref 13.0–17.0)
MCH: 25.2 pg — ABNORMAL LOW (ref 26.0–34.0)
MCHC: 33.7 g/dL (ref 30.0–36.0)
MCV: 74.8 fL — ABNORMAL LOW (ref 78.0–100.0)
Platelets: 246 10*3/uL (ref 150–400)
RBC: 6.31 MIL/uL — ABNORMAL HIGH (ref 4.22–5.81)
RDW: 14.1 % (ref 11.5–15.5)
WBC: 5.4 10*3/uL (ref 4.0–10.5)

## 2016-07-13 LAB — SURGICAL PCR SCREEN
MRSA, PCR: NEGATIVE
STAPHYLOCOCCUS AUREUS: NEGATIVE

## 2016-07-13 NOTE — Progress Notes (Signed)
PCP: [facility] :Pt. Doesn't know the name of his doctor.  Stated he hasn't taken blood pressure meds in 1 week due to not having money to get it refilled. Stated he possible could get money from his sister. Also stated to him to contact the [facility] and they may help him.  Informed him if blood pressure is not controlled when he comes in  For surgery, surgery may be cancelled. Also encouraged pt. To stop using marijuana prior to surgery.  Notified Violet BaldyAllison Zelenack,PA of above.

## 2016-07-13 NOTE — Pre-Procedure Instructions (Addendum)
    Brian BayleyMorris [name]  07/13/2016     No Pharmacies Listed   Your procedure is scheduled on  Nov. 13  Report to [facility] Admitting at 5:30 A.M.  Call this number if you have problems the morning of surgery:  [phone]   Remember:  Do not eat food or drink liquids after midnight Sunday, Nov 12   Take these medicines the morning of surgery with A SIP OF WATER : albuterol inhaler if needed-bring to hospital,             Stop aspirin,aleve, motrin, ibuprofen,BC Powders, Goody's.   Do not wear jewelry.  Do not wear lotions, powders, or cologne, or deoderant.  Do not shave 48 hours prior to surgery.  Men may shave face and neck.  Do not bring valuables to the hospital.  [facility] is not responsible for any belongings or valuables.  Contacts, dentures or bridgework may not be worn into surgery.  Leave your suitcase in the car.  After surgery it may be brought to your room.  For patients admitted to the hospital, discharge time will be determined by your treatment team.  Patients discharged the day of surgery will not be allowed to drive home.    Special instructions:  Review preparing for surgery  Please read over the following fact sheets that you were given. Coughing and Deep Breathing and MRSA Information

## 2016-07-14 NOTE — Progress Notes (Signed)
Anesthesia Chart Review:  Pt is a 60 year old male scheduled for C5-6, C6-7, C7-T1 ACDF on 07/19/2016 with Tressie StalkerJeffrey Jenkins, MD.   PMH includes:  HTN, hepatitis, alcohol abuse, marijuana use. Current smoker. BMI 25.5  BP (!) 141/99   Pulse 67   Temp 36.8 C   Resp 20   Ht 5\' 9"  (1.753 m)   Wt 173 lb 6.4 oz (78.7 kg)   SpO2 100%   BMI 25.61 kg/m    Medications include: albuterol, ASA, hctz.  Pt is currently out of hctz.  PAT RN instructed pt to refill and start taking again; warned surgery could be cancelled for uncontrolled HTN.   Preoperative labs reviewed.    EKG 04/30/16: NSR. LAFB.   If no changes, I anticipate pt can proceed with surgery as scheduled.   Rica Mastngela Torrence Branagan, FNP-BC [facility] Phone: [phone] 07/14/2016 1:42 PM

## 2016-07-19 ENCOUNTER — Inpatient Hospital Stay (HOSPITAL_COMMUNITY)
Admission: RE | Admit: 2016-07-19 | Discharge: 2016-07-21 | DRG: 472 | Disposition: A | Discharge status: Rehabilitation Facility | Payer: Medicaid Other | Source: Ambulatory Visit | Attending: Neurosurgery | Admitting: Neurosurgery

## 2016-07-19 ENCOUNTER — Inpatient Hospital Stay (HOSPITAL_COMMUNITY): Payer: Medicaid Other

## 2016-07-19 ENCOUNTER — Inpatient Hospital Stay (HOSPITAL_COMMUNITY): Payer: Medicaid Other | Admitting: Anesthesiology

## 2016-07-19 ENCOUNTER — Encounter (HOSPITAL_COMMUNITY)
Admission: RE | Disposition: A | Discharge status: Rehabilitation Facility | Payer: Self-pay | Source: Ambulatory Visit | Attending: Neurosurgery

## 2016-07-19 ENCOUNTER — Inpatient Hospital Stay (HOSPITAL_COMMUNITY): Payer: Medicaid Other | Admitting: Vascular Surgery

## 2016-07-19 ENCOUNTER — Encounter (HOSPITAL_COMMUNITY): Payer: Self-pay | Admitting: Certified Registered Nurse Anesthetist

## 2016-07-19 DIAGNOSIS — M50022 Cervical disc disorder at C5-C6 level with myelopathy: Secondary | ICD-10-CM | POA: Diagnosis present

## 2016-07-19 DIAGNOSIS — M4802 Spinal stenosis, cervical region: Secondary | ICD-10-CM | POA: Diagnosis present

## 2016-07-19 DIAGNOSIS — N189 Chronic kidney disease, unspecified: Secondary | ICD-10-CM | POA: Diagnosis not present

## 2016-07-19 DIAGNOSIS — M4723 Other spondylosis with radiculopathy, cervicothoracic region: Secondary | ICD-10-CM | POA: Diagnosis present

## 2016-07-19 DIAGNOSIS — M5003 Cervical disc disorder with myelopathy, cervicothoracic region: Secondary | ICD-10-CM | POA: Diagnosis not present

## 2016-07-19 DIAGNOSIS — M4804 Spinal stenosis, thoracic region: Secondary | ICD-10-CM | POA: Diagnosis present

## 2016-07-19 DIAGNOSIS — Z590 Homelessness: Secondary | ICD-10-CM

## 2016-07-19 DIAGNOSIS — M4712 Other spondylosis with myelopathy, cervical region: Secondary | ICD-10-CM | POA: Diagnosis present

## 2016-07-19 DIAGNOSIS — M5104 Intervertebral disc disorders with myelopathy, thoracic region: Secondary | ICD-10-CM | POA: Diagnosis present

## 2016-07-19 DIAGNOSIS — M21372 Foot drop, left foot: Secondary | ICD-10-CM | POA: Diagnosis present

## 2016-07-19 DIAGNOSIS — M4722 Other spondylosis with radiculopathy, cervical region: Secondary | ICD-10-CM

## 2016-07-19 DIAGNOSIS — M5136 Other intervertebral disc degeneration, lumbar region: Secondary | ICD-10-CM | POA: Diagnosis present

## 2016-07-19 DIAGNOSIS — M542 Cervicalgia: Secondary | ICD-10-CM | POA: Diagnosis present

## 2016-07-19 DIAGNOSIS — J45909 Unspecified asthma, uncomplicated: Secondary | ICD-10-CM | POA: Diagnosis present

## 2016-07-19 DIAGNOSIS — Z91010 Allergy to peanuts: Secondary | ICD-10-CM | POA: Diagnosis not present

## 2016-07-19 DIAGNOSIS — K759 Inflammatory liver disease, unspecified: Secondary | ICD-10-CM | POA: Diagnosis present

## 2016-07-19 DIAGNOSIS — I10 Essential (primary) hypertension: Secondary | ICD-10-CM

## 2016-07-19 DIAGNOSIS — M21371 Foot drop, right foot: Secondary | ICD-10-CM | POA: Diagnosis present

## 2016-07-19 DIAGNOSIS — N183 Chronic kidney disease, stage 3 unspecified: Secondary | ICD-10-CM

## 2016-07-19 DIAGNOSIS — Z79899 Other long term (current) drug therapy: Secondary | ICD-10-CM

## 2016-07-19 DIAGNOSIS — F17210 Nicotine dependence, cigarettes, uncomplicated: Secondary | ICD-10-CM | POA: Diagnosis present

## 2016-07-19 DIAGNOSIS — M50122 Cervical disc disorder at C5-C6 level with radiculopathy: Secondary | ICD-10-CM | POA: Diagnosis present

## 2016-07-19 DIAGNOSIS — Z7982 Long term (current) use of aspirin: Secondary | ICD-10-CM

## 2016-07-19 DIAGNOSIS — Z419 Encounter for procedure for purposes other than remedying health state, unspecified: Secondary | ICD-10-CM

## 2016-07-19 DIAGNOSIS — G8918 Other acute postprocedural pain: Secondary | ICD-10-CM | POA: Diagnosis not present

## 2016-07-19 DIAGNOSIS — K5903 Drug induced constipation: Secondary | ICD-10-CM

## 2016-07-19 HISTORY — DX: Unspecified intracranial injury with loss of consciousness of unspecified duration, initial encounter: S06.9X9A

## 2016-07-19 HISTORY — PX: ANTERIOR CERVICAL DECOMP/DISCECTOMY FUSION: SHX1161

## 2016-07-19 HISTORY — DX: Other intervertebral disc degeneration, lumbar region: M51.36

## 2016-07-19 SURGERY — ANTERIOR CERVICAL DECOMPRESSION/DISCECTOMY FUSION 3 LEVELS
Anesthesia: General

## 2016-07-19 MED ORDER — DEXAMETHASONE SODIUM PHOSPHATE 4 MG/ML IJ SOLN: 4.0000 mg | Freq: Four times a day (QID) | INTRAMUSCULAR | Status: AC

## 2016-07-19 MED ORDER — GLYCOPYRROLATE 0.2 MG/ML IV SOSY
PREFILLED_SYRINGE | INTRAVENOUS | Status: DC | PRN
  Administered 2016-07-19: 0.4 mg via INTRAVENOUS

## 2016-07-19 MED ORDER — PROMETHAZINE HCL 25 MG/ML IJ SOLN: 6.2500 mg | INTRAMUSCULAR | Status: DC | PRN

## 2016-07-19 MED ORDER — LACTATED RINGERS IV SOLN: INTRAVENOUS | Status: DC

## 2016-07-19 MED ORDER — ACETAMINOPHEN 325 MG PO TABS: 650.0000 mg | ORAL_TABLET | ORAL | Status: DC | PRN

## 2016-07-19 MED ORDER — FENTANYL CITRATE (PF) 100 MCG/2ML IJ SOLN
INTRAMUSCULAR | Status: DC | PRN
  Administered 2016-07-19 (×4): 50 ug via INTRAVENOUS
  Administered 2016-07-19: 25 ug via INTRAVENOUS
  Administered 2016-07-19: 50 ug via INTRAVENOUS
  Administered 2016-07-19: 25 ug via INTRAVENOUS
  Administered 2016-07-19: 100 ug via INTRAVENOUS

## 2016-07-19 MED ORDER — BISACODYL 10 MG RE SUPP: 10.0000 mg | Freq: Every day | RECTAL | Status: DC | PRN

## 2016-07-19 MED ORDER — PROPOFOL 10 MG/ML IV BOLUS
INTRAVENOUS | Status: DC | PRN
  Administered 2016-07-19: 20 mg via INTRAVENOUS
  Administered 2016-07-19: 10 mg via INTRAVENOUS
  Administered 2016-07-19: 180 mg via INTRAVENOUS

## 2016-07-19 MED ORDER — FENTANYL CITRATE (PF) 100 MCG/2ML IJ SOLN
INTRAMUSCULAR | Status: AC
  Filled 2016-07-19: qty 4

## 2016-07-19 MED ORDER — PHENYLEPHRINE 40 MCG/ML (10ML) SYRINGE FOR IV PUSH (FOR BLOOD PRESSURE SUPPORT)
PREFILLED_SYRINGE | INTRAVENOUS | Status: AC
  Filled 2016-07-19: qty 10

## 2016-07-19 MED ORDER — SUCCINYLCHOLINE CHLORIDE 200 MG/10ML IV SOSY
PREFILLED_SYRINGE | INTRAVENOUS | Status: AC
  Filled 2016-07-19: qty 10

## 2016-07-19 MED ORDER — ONDANSETRON HCL 4 MG/2ML IJ SOLN
INTRAMUSCULAR | Status: DC | PRN
  Administered 2016-07-19: 4 mg via INTRAVENOUS

## 2016-07-19 MED ORDER — CHLORHEXIDINE GLUCONATE CLOTH 2 % EX PADS: 6.0000 | MEDICATED_PAD | Freq: Once | CUTANEOUS | Status: DC

## 2016-07-19 MED ORDER — ONDANSETRON HCL 4 MG/2ML IJ SOLN: 4.0000 mg | INTRAMUSCULAR | Status: DC | PRN

## 2016-07-19 MED ORDER — ALUM & MAG HYDROXIDE-SIMETH 200-200-20 MG/5ML PO SUSP: 30.0000 mL | Freq: Four times a day (QID) | ORAL | Status: DC | PRN

## 2016-07-19 MED ORDER — SUCCINYLCHOLINE CHLORIDE 20 MG/ML IJ SOLN
INTRAMUSCULAR | Status: DC | PRN
  Administered 2016-07-19: 120 mg via INTRAVENOUS

## 2016-07-19 MED ORDER — PROPOFOL 10 MG/ML IV BOLUS
INTRAVENOUS | Status: AC
  Filled 2016-07-19: qty 20

## 2016-07-19 MED ORDER — NEOSTIGMINE METHYLSULFATE 5 MG/5ML IV SOSY
PREFILLED_SYRINGE | INTRAVENOUS | Status: AC
  Filled 2016-07-19: qty 5

## 2016-07-19 MED ORDER — THROMBIN 5000 UNITS EX SOLR
CUTANEOUS | Status: DC | PRN
  Administered 2016-07-19: 12:00:00 via TOPICAL

## 2016-07-19 MED ORDER — OXYCODONE-ACETAMINOPHEN 5-325 MG PO TABS
1.0000 | ORAL_TABLET | ORAL | Status: DC | PRN
  Administered 2016-07-19 – 2016-07-21 (×8): 2 via ORAL
  Filled 2016-07-19 (×8): qty 2

## 2016-07-19 MED ORDER — HYDROCHLOROTHIAZIDE 25 MG PO TABS
25.0000 mg | ORAL_TABLET | Freq: Every day | ORAL | Status: DC
  Administered 2016-07-20 – 2016-07-21 (×2): 25 mg via ORAL
  Filled 2016-07-19 (×2): qty 1

## 2016-07-19 MED ORDER — ACETAMINOPHEN 650 MG RE SUPP: 650.0000 mg | RECTAL | Status: DC | PRN

## 2016-07-19 MED ORDER — LIDOCAINE 2% (20 MG/ML) 5 ML SYRINGE
INTRAMUSCULAR | Status: DC | PRN
  Administered 2016-07-19: 100 mg via INTRAVENOUS

## 2016-07-19 MED ORDER — ALBUMIN HUMAN 5 % IV SOLN
INTRAVENOUS | Status: DC | PRN
  Administered 2016-07-19: 13:00:00 via INTRAVENOUS

## 2016-07-19 MED ORDER — GLYCOPYRROLATE 0.2 MG/ML IV SOSY
PREFILLED_SYRINGE | INTRAVENOUS | Status: AC
  Filled 2016-07-19: qty 3

## 2016-07-19 MED ORDER — ROCURONIUM BROMIDE 10 MG/ML (PF) SYRINGE
PREFILLED_SYRINGE | INTRAVENOUS | Status: AC
  Filled 2016-07-19: qty 10

## 2016-07-19 MED ORDER — MENTHOL 3 MG MT LOZG
1.0000 | LOZENGE | OROMUCOSAL | Status: DC | PRN
  Filled 2016-07-19: qty 9

## 2016-07-19 MED ORDER — AMLODIPINE BESYLATE 10 MG PO TABS
10.0000 mg | ORAL_TABLET | Freq: Every day | ORAL | Status: DC
  Administered 2016-07-20 – 2016-07-21 (×2): 10 mg via ORAL
  Filled 2016-07-19 (×2): qty 1

## 2016-07-19 MED ORDER — ALBUTEROL SULFATE (2.5 MG/3ML) 0.083% IN NEBU: 2.5000 mg | INHALATION_SOLUTION | Freq: Four times a day (QID) | RESPIRATORY_TRACT | Status: DC | PRN

## 2016-07-19 MED ORDER — DEXAMETHASONE 4 MG PO TABS
4.0000 mg | ORAL_TABLET | Freq: Four times a day (QID) | ORAL | Status: AC
  Administered 2016-07-19 – 2016-07-20 (×3): 4 mg via ORAL
  Filled 2016-07-19 (×3): qty 1

## 2016-07-19 MED ORDER — ROCURONIUM BROMIDE 10 MG/ML (PF) SYRINGE
PREFILLED_SYRINGE | INTRAVENOUS | Status: DC | PRN
  Administered 2016-07-19: 20 mg via INTRAVENOUS
  Administered 2016-07-19: 50 mg via INTRAVENOUS
  Administered 2016-07-19: 30 mg via INTRAVENOUS

## 2016-07-19 MED ORDER — MIDAZOLAM HCL 2 MG/2ML IJ SOLN
INTRAMUSCULAR | Status: AC
  Filled 2016-07-19: qty 2

## 2016-07-19 MED ORDER — LACTATED RINGERS IV SOLN
INTRAVENOUS | Status: DC
  Administered 2016-07-19 (×4): via INTRAVENOUS

## 2016-07-19 MED ORDER — THROMBIN 5000 UNITS EX SOLR
CUTANEOUS | Status: AC
  Filled 2016-07-19: qty 5000

## 2016-07-19 MED ORDER — BACITRACIN ZINC 500 UNIT/GM EX OINT
TOPICAL_OINTMENT | CUTANEOUS | Status: DC | PRN
  Administered 2016-07-19: 1 via TOPICAL

## 2016-07-19 MED ORDER — CEFAZOLIN SODIUM-DEXTROSE 2-4 GM/100ML-% IV SOLN
2.0000 g | Freq: Three times a day (TID) | INTRAVENOUS | Status: AC
  Administered 2016-07-19 – 2016-07-20 (×2): 2 g via INTRAVENOUS
  Filled 2016-07-19 (×2): qty 100

## 2016-07-19 MED ORDER — DOCUSATE SODIUM 100 MG PO CAPS
100.0000 mg | ORAL_CAPSULE | Freq: Two times a day (BID) | ORAL | Status: DC
  Administered 2016-07-19 – 2016-07-21 (×4): 100 mg via ORAL
  Filled 2016-07-19 (×4): qty 1

## 2016-07-19 MED ORDER — HYDROMORPHONE HCL 2 MG/ML IJ SOLN
INTRAMUSCULAR | Status: AC
  Filled 2016-07-19: qty 1

## 2016-07-19 MED ORDER — 0.9 % SODIUM CHLORIDE (POUR BTL) OPTIME
TOPICAL | Status: DC | PRN
  Administered 2016-07-19: 1000 mL

## 2016-07-19 MED ORDER — LIDOCAINE 2% (20 MG/ML) 5 ML SYRINGE
INTRAMUSCULAR | Status: AC
  Filled 2016-07-19: qty 5

## 2016-07-19 MED ORDER — MIDAZOLAM HCL 5 MG/5ML IJ SOLN
INTRAMUSCULAR | Status: DC | PRN
  Administered 2016-07-19: 2 mg via INTRAVENOUS

## 2016-07-19 MED ORDER — FENTANYL CITRATE (PF) 100 MCG/2ML IJ SOLN
INTRAMUSCULAR | Status: AC
  Filled 2016-07-19: qty 2

## 2016-07-19 MED ORDER — HYDROCODONE-ACETAMINOPHEN 5-325 MG PO TABS: 1.0000 | ORAL_TABLET | ORAL | Status: DC | PRN

## 2016-07-19 MED ORDER — MORPHINE SULFATE (PF) 4 MG/ML IV SOLN: 1.0000 mg | INTRAVENOUS | Status: DC | PRN

## 2016-07-19 MED ORDER — DEXAMETHASONE SODIUM PHOSPHATE 10 MG/ML IJ SOLN
INTRAMUSCULAR | Status: DC | PRN
  Administered 2016-07-19: 10 mg via INTRAVENOUS

## 2016-07-19 MED ORDER — THROMBIN 20000 UNITS EX SOLR
CUTANEOUS | Status: AC
  Filled 2016-07-19: qty 20000

## 2016-07-19 MED ORDER — CEFAZOLIN SODIUM-DEXTROSE 2-4 GM/100ML-% IV SOLN
2.0000 g | INTRAVENOUS | Status: AC
  Administered 2016-07-19: 2 g via INTRAVENOUS
  Filled 2016-07-19: qty 100

## 2016-07-19 MED ORDER — DIAZEPAM 5 MG PO TABS
5.0000 mg | ORAL_TABLET | Freq: Four times a day (QID) | ORAL | Status: DC | PRN
  Administered 2016-07-19 – 2016-07-20 (×3): 5 mg via ORAL
  Filled 2016-07-19 (×3): qty 1

## 2016-07-19 MED ORDER — DEXAMETHASONE SODIUM PHOSPHATE 10 MG/ML IJ SOLN
INTRAMUSCULAR | Status: AC
  Filled 2016-07-19: qty 1

## 2016-07-19 MED ORDER — ONDANSETRON HCL 4 MG/2ML IJ SOLN
INTRAMUSCULAR | Status: AC
  Filled 2016-07-19: qty 2

## 2016-07-19 MED ORDER — THROMBIN 20000 UNITS EX SOLR
CUTANEOUS | Status: DC | PRN
  Administered 2016-07-19: 10:00:00 via TOPICAL

## 2016-07-19 MED ORDER — PHENOL 1.4 % MT LIQD
1.0000 | OROMUCOSAL | Status: DC | PRN
Start: 2016-07-19 — End: 2016-07-21
  Administered 2016-07-20: 1 via OROMUCOSAL
  Filled 2016-07-19: qty 177

## 2016-07-19 MED ORDER — BACITRACIN ZINC 500 UNIT/GM EX OINT
TOPICAL_OINTMENT | CUTANEOUS | Status: AC
  Filled 2016-07-19: qty 28.35

## 2016-07-19 MED ORDER — LIDOCAINE-EPINEPHRINE 1 %-1:100000 IJ SOLN
INTRAMUSCULAR | Status: AC
  Filled 2016-07-19: qty 1

## 2016-07-19 MED ORDER — HYDROMORPHONE HCL 1 MG/ML IJ SOLN
0.2500 mg | INTRAMUSCULAR | Status: DC | PRN
  Administered 2016-07-19 (×3): 0.5 mg via INTRAVENOUS

## 2016-07-19 MED ORDER — ALBUTEROL SULFATE HFA 108 (90 BASE) MCG/ACT IN AERS: 2.0000 | INHALATION_SPRAY | Freq: Four times a day (QID) | RESPIRATORY_TRACT | Status: DC | PRN

## 2016-07-19 MED ORDER — NEOSTIGMINE METHYLSULFATE 5 MG/5ML IV SOSY
PREFILLED_SYRINGE | INTRAVENOUS | Status: DC | PRN
  Administered 2016-07-19: 3 mg via INTRAVENOUS

## 2016-07-19 MED ORDER — LIDOCAINE-EPINEPHRINE 1 %-1:100000 IJ SOLN
INTRAMUSCULAR | Status: DC | PRN
  Administered 2016-07-19: 10 mL

## 2016-07-19 MED ORDER — PHENYLEPHRINE HCL 10 MG/ML IJ SOLN
INTRAMUSCULAR | Status: DC | PRN
  Administered 2016-07-19: 40 ug via INTRAVENOUS
  Administered 2016-07-19: 80 ug via INTRAVENOUS

## 2016-07-19 MED ORDER — SODIUM CHLORIDE 0.9 % IR SOLN
Status: DC | PRN
  Administered 2016-07-19: 10:00:00

## 2016-07-19 MED ORDER — ARTIFICIAL TEARS OP OINT
TOPICAL_OINTMENT | OPHTHALMIC | Status: AC
  Filled 2016-07-19: qty 3.5

## 2016-07-19 SURGICAL SUPPLY — 61 items
APL SKNCLS STERI-STRIP NONHPOA (GAUZE/BANDAGES/DRESSINGS) ×1
BAG DECANTER FOR FLEXI CONT (MISCELLANEOUS) ×3 IMPLANT
BENZOIN TINCTURE PRP APPL 2/3 (GAUZE/BANDAGES/DRESSINGS) ×4 IMPLANT
BIT DRILL NEURO 2X3.1 SFT TUCH (MISCELLANEOUS) ×1 IMPLANT
BLADE SURG 15 STRL LF DISP TIS (BLADE) ×1 IMPLANT
BLADE SURG 15 STRL SS (BLADE) ×3
BLADE ULTRA TIP 2M (BLADE) ×3 IMPLANT
BUR BARREL STRAIGHT FLUTE 4.0 (BURR) ×3 IMPLANT
BUR MATCHSTICK NEURO 3.0 LAGG (BURR) ×3 IMPLANT
CANISTER SUCT 3000ML PPV (MISCELLANEOUS) ×3 IMPLANT
CLOSURE WOUND 1/2 X4 (GAUZE/BANDAGES/DRESSINGS) ×1
COVER MAYO STAND STRL (DRAPES) ×3 IMPLANT
DEVICE FUSION VIST S 14X14X6MM (Trauma) IMPLANT
DRAPE LAPAROTOMY 100X72 PEDS (DRAPES) ×3 IMPLANT
DRAPE MICROSCOPE LEICA (MISCELLANEOUS) IMPLANT
DRAPE POUCH INSTRU U-SHP 10X18 (DRAPES) ×3 IMPLANT
DRAPE SURG 17X23 STRL (DRAPES) ×6 IMPLANT
DRILL NEURO 2X3.1 SOFT TOUCH (MISCELLANEOUS) ×3
ELECT BLADE 4.0 EZ CLEAN MEGAD (MISCELLANEOUS) ×3
ELECT REM PT RETURN 9FT ADLT (ELECTROSURGICAL) ×3
ELECTRODE BLDE 4.0 EZ CLN MEGD (MISCELLANEOUS) IMPLANT
ELECTRODE REM PT RTRN 9FT ADLT (ELECTROSURGICAL) ×1 IMPLANT
GAUZE SPONGE 4X4 12PLY STRL (GAUZE/BANDAGES/DRESSINGS) ×3 IMPLANT
GAUZE SPONGE 4X4 16PLY XRAY LF (GAUZE/BANDAGES/DRESSINGS) IMPLANT
GLOVE BIO SURGEON STRL SZ8 (GLOVE) ×3 IMPLANT
GLOVE BIO SURGEON STRL SZ8.5 (GLOVE) ×3 IMPLANT
GLOVE EXAM NITRILE LRG STRL (GLOVE) IMPLANT
GLOVE EXAM NITRILE XL STR (GLOVE) IMPLANT
GLOVE EXAM NITRILE XS STR PU (GLOVE) IMPLANT
GOWN STRL REUS W/ TWL LRG LVL3 (GOWN DISPOSABLE) IMPLANT
GOWN STRL REUS W/ TWL XL LVL3 (GOWN DISPOSABLE) IMPLANT
GOWN STRL REUS W/TWL LRG LVL3 (GOWN DISPOSABLE)
GOWN STRL REUS W/TWL XL LVL3 (GOWN DISPOSABLE)
HEMOSTAT POWDER KIT SURGIFOAM (HEMOSTASIS) ×3 IMPLANT
KIT BASIN OR (CUSTOM PROCEDURE TRAY) ×3 IMPLANT
KIT ROOM TURNOVER OR (KITS) ×3 IMPLANT
MARKER SKIN DUAL TIP RULER LAB (MISCELLANEOUS) ×3 IMPLANT
NDL SPNL 18GX3.5 QUINCKE PK (NEEDLE) ×1 IMPLANT
NEEDLE HYPO 22GX1.5 SAFETY (NEEDLE) ×3 IMPLANT
NEEDLE SPNL 18GX3.5 QUINCKE PK (NEEDLE) ×3 IMPLANT
NS IRRIG 1000ML POUR BTL (IV SOLUTION) ×3 IMPLANT
PACK LAMINECTOMY NEURO (CUSTOM PROCEDURE TRAY) ×3 IMPLANT
PATTIES SURGICAL .5 X.5 (GAUZE/BANDAGES/DRESSINGS) ×2 IMPLANT
PATTIES SURGICAL 1X1 (DISPOSABLE) ×2 IMPLANT
PEEK VISTA 14X14X7MM (Peek) ×2 IMPLANT
PIN DISTRACTION 14MM (PIN) ×6 IMPLANT
PLATE ANT CERV XTEND 3 LV 42 (Plate) ×2 IMPLANT
PUTTY KINEX BIOACTIVE 2CC (Bone Implant) ×2 IMPLANT
PUTTY KINEX BIOACTIVE 5CC (Bone Implant) ×2 IMPLANT
RUBBERBAND STERILE (MISCELLANEOUS) IMPLANT
SCREW XTD VAR 4.2 SELF TAP (Screw) ×16 IMPLANT
SPONGE INTESTINAL PEANUT (DISPOSABLE) ×8 IMPLANT
SPONGE SURGIFOAM ABS GEL 100 (HEMOSTASIS) ×3 IMPLANT
STRIP CLOSURE SKIN 1/2X4 (GAUZE/BANDAGES/DRESSINGS) ×2 IMPLANT
SUT VIC AB 0 CT1 27 (SUTURE) ×3
SUT VIC AB 0 CT1 27XBRD ANTBC (SUTURE) ×1 IMPLANT
SUT VIC AB 3-0 SH 8-18 (SUTURE) ×3 IMPLANT
TOWEL OR 17X24 6PK STRL BLUE (TOWEL DISPOSABLE) ×3 IMPLANT
TOWEL OR 17X26 10 PK STRL BLUE (TOWEL DISPOSABLE) ×3 IMPLANT
VISTA S O 14X14X6MM (Trauma) ×6 IMPLANT
WATER STERILE IRR 1000ML POUR (IV SOLUTION) ×3 IMPLANT

## 2016-07-19 NOTE — Transfer of Care (Signed)
Immediate Anesthesia Transfer of Care Note  Patient: Darcel BayleyMorris D Monarch  Procedure(s) Performed: Procedure(s): ANTERIOR CERVICAL DECOMPRESSION/DISCECTOMY FUSION CERVICAL FIVE CERVICAL SIX,  CERVICAL SIX-SEVEN,CERVICAL SEVEN THORACIC ONE (N/A)  Patient Location: PACU  Anesthesia Type:General  Level of Consciousness: responds to stimulation  Airway & Oxygen Therapy: Patient Spontanous Breathing and Patient connected to nasal cannula oxygen  Post-op Assessment: Report given to RN, Post -op Vital signs reviewed and stable and Patient moving all extremities X 4  Post vital signs: Reviewed and stable  Last Vitals:  Vitals:   07/19/16 0741  BP: (!) 133/99  Pulse: 64  Resp: 20  Temp: 36.8 C    Last Pain:  Vitals:   07/19/16 0741  TempSrc: Oral      Patients Stated Pain Goal: 5 (07/19/16 0741)  Complications: No apparent anesthesia complications

## 2016-07-19 NOTE — Anesthesia Preprocedure Evaluation (Signed)
Anesthesia Evaluation  Patient identified by MRN, date of birth, ID band Patient awake    Reviewed: Allergy & Precautions, NPO status , Patient's Chart, lab work & pertinent test results  History of Anesthesia Complications Negative for: history of anesthetic complications  Airway Mallampati: II  TM Distance: >3 FB Neck ROM: Full    Dental no notable dental hx. (+) Dental Advisory Given   Pulmonary asthma , Current Smoker,    Pulmonary exam normal        Cardiovascular hypertension, negative cardio ROS Normal cardiovascular exam     Neuro/Psych negative psych ROS   GI/Hepatic negative GI ROS, (+) Hepatitis -  Endo/Other  negative endocrine ROS  Renal/GU negative Renal ROS  negative genitourinary   Musculoskeletal negative musculoskeletal ROS (+)   Abdominal   Peds negative pediatric ROS (+)  Hematology negative hematology ROS (+)   Anesthesia Other Findings   Reproductive/Obstetrics negative OB ROS                             Anesthesia Physical Anesthesia Plan  ASA: III  Anesthesia Plan: General   Post-op Pain Management:    Induction: Intravenous  Airway Management Planned: Oral ETT  Additional Equipment:   Intra-op Plan:   Post-operative Plan: Extubation in OR  Informed Consent: I have reviewed the patients History and Physical, chart, labs and discussed the procedure including the risks, benefits and alternatives for the proposed anesthesia with the patient or authorized representative who has indicated his/her understanding and acceptance.   Dental advisory given  Plan Discussed with:   Anesthesia Plan Comments:         Anesthesia Quick Evaluation

## 2016-07-19 NOTE — Progress Notes (Signed)
Patient ID: Brian Kelley, male   DOB: 1956/08/19, 60 y.o.   MRN: [id number] The patient's dressing is clean and dry. There is no hematoma or shift.  The patient is more or less homeless. We will ask the care management to help us with disposition.

## 2016-07-19 NOTE — Progress Notes (Signed)
Patient ID: Brian Kelley, male   DOB: [birth year], 60 y.o.   MRN: [id number] Subjective:  The patient is alert and pleasant. He looks well. His leg pain is gone. He wants to go home tomorrow.  Objective: Vital signs in last 24 hours: Temp:  [97.4 F (36.3 C)-99.1 F (37.3 C)] 98.6 F (37 C) (11/13 1639) Pulse Rate:  [64-104] 104 (11/13 1639) Resp:  [10-23] 16 (11/13 1639) BP: (119-148)/(74-99) 148/92 (11/13 1639) SpO2:  [96 %-100 %] 96 % (11/13 1639)  Intake/Output from previous day: No intake/output data recorded. Intake/Output this shift: Total I/O In: 2250 [I.V.:2000; IV Piggyback:250] Out: 365 [Urine:215; Blood:150]  Physical exam the patient is alert and pleasant. He is moving his lower extremities well. He looks well.  Lab Results: No results for input(s): WBC, HGB, HCT, PLT in the last 72 hours. BMET No results for input(s): NA, K, CL, CO2, GLUCOSE, BUN, CREATININE, CALCIUM in the last 72 hours.  Studies/Results: Dg Cervical Spine 2-3 Views  Result Date: 07/19/2016 CLINICAL DATA:  Intraoperative localization for spine surgery. EXAM: CERVICAL SPINE - 2-3 VIEW COMPARISON:  Cervical spine MRI 04/30/2016 FINDINGS: The first image shows a spinal needle marking the C5 level. The second image shows a needle at the C6-7 level. The last image demonstrates anterior plate and screws with the upper screws at the C5 level. Bottom of the plate is not visualized. IMPRESSION: Cervical fusion Electronically Signed   By: Rudie MeyerP.  Gallerani M.D.   On: 07/19/2016 14:37    Assessment/Plan: The patient is doing well. He will likely go home tomorrow.  LOS: 0 days     Jahaad Penado D 07/19/2016, 6:06 PM

## 2016-07-19 NOTE — OR Nursing (Signed)
FOLEY CATHETER PLACED AT BEGINNING OF SURGICAL PROCEDURE PER VERBAL ORDER OF DR Krista BlueSINGER

## 2016-07-19 NOTE — Progress Notes (Signed)
Orthopedic Tech Progress Note Patient Details:  Darcel BayleyMorris D Kernan 23-Oct-1955 [id number]  Patient ID: Darcel BayleyMorris D Urbanowicz, male   DOB: 23-Oct-1955, 60 y.o.   MRN: [id number]   Saul FordyceJennifer C Peggye Poon 07/19/2016, 3:05 PMCalled [employer] for Aspen collar.

## 2016-07-19 NOTE — Anesthesia Postprocedure Evaluation (Signed)
Anesthesia Post Note  Patient: Brian Kelley  Procedure(s) Performed: Procedure(s) (LRB): ANTERIOR CERVICAL DECOMPRESSION/DISCECTOMY FUSION CERVICAL FIVE CERVICAL SIX,  CERVICAL SIX-SEVEN,CERVICAL SEVEN THORACIC ONE (N/A)  Patient location during evaluation: PACU Anesthesia Type: General Level of consciousness: sedated Pain management: pain level controlled Vital Signs Assessment: post-procedure vital signs reviewed and stable Respiratory status: spontaneous breathing and respiratory function stable Cardiovascular status: stable Anesthetic complications: no    Last Vitals:  Vitals:   07/19/16 1553 07/19/16 1608  BP: 119/74 128/90  Pulse: 92 92  Resp: 10 14  Temp:  36.3 C    Last Pain:  Vitals:   07/19/16 1608  TempSrc:   PainSc: Asleep                 Gwendolyne Welford DANIEL

## 2016-07-19 NOTE — H&P (Signed)
Subjective: The patient is a 60 year old black male who has developed hand weakness, numbness, gait ataxia, etc. He was worked up with a cervical MRI which demonstrated significant spondylosis and stenosis at C5-6, C6-7 and C7-T1. I discussed the various treatment options and recommended surgery. The patient has decided to proceed with a 3 level anterior cervical discectomy, fusion, and plating after weighing the risks, benefits, and alternatives to surgery.   Past Medical History:  Diagnosis Date  . Alcohol abuse   . Arm fracture    left arm  . Asthma   . Back pain   . Headache   . Hepatitis   . Hypertension     Past Surgical History:  Procedure Laterality Date  . COLONOSCOPY      Allergies  Allergen Reactions  . Peanuts [Peanut Oil] Shortness Of Breath    Social History  Substance Use Topics  . Smoking status: Current Every Day Smoker    Packs/day: 0.25    Years: 30.00    Types: Cigarettes  . Smokeless tobacco: Never Used  . Alcohol use 4.2 oz/week    7 Cans of beer per week    History reviewed. No pertinent family history. Prior to Admission medications   Medication Sig Start Date End Date Taking? Authorizing Provider  albuterol (PROVENTIL HFA;VENTOLIN HFA) 108 (90 Base) MCG/ACT inhaler Inhale 2 puffs into the lungs every 6 (six) hours as needed for wheezing or shortness of breath.   Yes Historical Provider, MD  amLODipine (NORVASC) 10 MG tablet Take 10 mg by mouth daily.   Yes Historical Provider, MD  aspirin EC 325 MG tablet Take 325 mg by mouth daily.   Yes Historical Provider, MD  hydrochlorothiazide (HYDRODIURIL) 25 MG tablet Take 25 mg by mouth daily.   Yes Historical Provider, MD     Review of Systems  Positive ROS: As above  All other systems have been reviewed and were otherwise negative with the exception of those mentioned in the HPI and as above.  Objective: Vital signs in last 24 hours: Temp:  [98.3 F (36.8 C)] 98.3 F (36.8 C) (11/13 0741) Pulse  Rate:  [64] 64 (11/13 0741) Resp:  [20] 20 (11/13 0741) BP: (133)/(99) 133/99 (11/13 0741) SpO2:  [99 %] 99 % (11/13 0741)  General Appearance: Alert, cooperative, no distress, Head: Normocephalic, without obvious abnormality, atraumatic Eyes: PERRL, conjunctiva/corneas clear, EOM's intact,    Ears: Normal  Throat: Normal  Neck: Supple, symmetrical, trachea midline, no adenopathy; thyroid: No enlargement/tenderness/nodules; no carotid bruit or JVD Back: Symmetric, no curvature, ROM normal, no CVA tenderness Lungs: Clear to auscultation bilaterally, respirations unlabored Heart: Regular rate and rhythm, no murmur, rub or gallop Abdomen: Soft, non-tender,, no masses, no organomegaly Extremities: Extremities normal, atraumatic, no cyanosis or edema Pulses: 2+ and symmetric all extremities Skin: Skin color, texture, turgor normal, no rashes or lesions  NEUROLOGIC:   Mental status: alert and oriented, no aphasia, good attention span, Fund of knowledge/ memory ok Motor Exam - the patient is quadriparetic, with weakness particularly in his bilateral hands Sensory Exam - grossly normal Reflexes: The patient is hyperreflexic. Coordination - grossly normal Gait - unsteady Balance - unsteady Cranial Nerves: I: smell Not tested  II: visual acuity  OS: Normal  OD: Normal   II: visual fields Full to confrontation  II: pupils Equal, round, reactive to light  III,VII: ptosis None  III,IV,VI: extraocular muscles  Full ROM  V: mastication Normal  V: facial light touch sensation  Normal  V,VII:  corneal reflex  Present  VII: facial muscle function - upper  Normal  VII: facial muscle function - lower Normal  VIII: hearing Not tested  IX: soft palate elevation  Normal  IX,X: gag reflex Present  XI: trapezius strength  5/5  XI: sternocleidomastoid strength 5/5  XI: neck flexion strength  5/5  XII: tongue strength  Normal    Data Review Lab Results  Component Value Date   WBC 5.4  07/13/2016   HGB 15.9 07/13/2016   HCT 47.2 07/13/2016   MCV 74.8 (L) 07/13/2016   PLT 246 07/13/2016   Lab Results  Component Value Date   NA 138 07/13/2016   K 3.8 07/13/2016   CL 102 07/13/2016   CO2 26 07/13/2016   BUN 13 07/13/2016   CREATININE 1.33 (H) 07/13/2016   GLUCOSE 76 07/13/2016   Lab Results  Component Value Date   INR 0.90 05/17/2010    Assessment/Plan: C5-6, C6-7 and C7-T1 spondylosis, stenosis, cervical myelopathy: I have discussed situation with the patient. I have reviewed his imaging studies with him and pointed out the abnormalities. We have discussed the various treatment options including surgery. I have described the surgical treatment option of the C5-6, C6-7 and C7-T1 anterior cervical discectomy, fusion, and plating. I have shown him surgical models. We have discussed the risk, benefits, alternatives, expected postoperative course, and likelihood of achieving our goals for surgery. I answered all the patient's questions. He has decided to proceed with surgery.   Avanelle Pixley D 07/19/2016 9:29 AM

## 2016-07-19 NOTE — Progress Notes (Signed)
Dr Lovell SheehanJenkins notified by Rosey Batheresa, RN of inability to plantar flex BLE. Movement weak but present in all 4 extremities.

## 2016-07-19 NOTE — OR Nursing (Signed)
FOLEY CATHETER REMOVED AT END OF PROCEDURE

## 2016-07-19 NOTE — Op Note (Signed)
Brief history: The patient is a 60 year old black male who presented with a cervical myelopathy. He was worked up with a cervical MRI which demonstrated multilevel degenerative changes with stenosis most prominent at C5-6, C6-7 and C7-T1. I discussed the various treatment options with the patient and recommended he undergo surgery. He has weighed the risks, benefits, and alternatives to surgery and decided proceed with a 3 level anterior cervical discectomy, fusion, and plating.  Preoperative diagnosis: C5-6, C6-7 and C7-T1 spondylosis, stenosis, cervicalgia, cervical myelopathy, cervical radiculopathy  Postoperative diagnosis: The same  Procedure: C5-6, C6-7 and C7-T1 Anterior cervical discectomy/decompression; C5-6, C6-7 and C7-T1 interbody arthrodesis with local morcellized autograft bone and Kinnex bone graft extender; insertion of interbody prosthesis at C5-6, C6-7 and C7-T1 (Zimmer peek interbody prosthesis); anterior cervical plating from C5-T1 with globus titanium plate  Surgeon: Dr. Delma OfficerJeff Robert Sunga  Asst.: Dr. Jordan LikesPool  Anesthesia: Gen. endotracheal  Estimated blood loss: 125 mL  Drains: None  Complications: None  Description of procedure: The patient was brought to the operating room by the anesthesia team. General endotracheal anesthesia was induced. A roll was placed under the patient's shoulders to keep the neck in the neutral position. The patient's anterior cervical region was then prepared with Betadine scrub and Betadine solution. Sterile drapes were applied.  The area to be incised was then injected with Marcaine with epinephrine solution. I then used a scalpel to make a transverse incision in the patient's left anterior neck. I used the Metzenbaum scissors to divide the platysmal muscle and then to dissect medial to the sternocleidomastoid muscle, jugular vein, and carotid artery. I carefully dissected down towards the anterior cervical spine identifying the esophagus and retracting  it medially. Then using Kitner swabs to clear soft tissue from the anterior cervical spine. We then inserted a bent spinal needle into the upper exposed intervertebral disc space. We then obtained intraoperative radiographs confirm our location.  I then used electrocautery to detach the medial border of the longus colli muscle bilaterally from the C5-6, C6-7 and C7-T1 intervertebral disc spaces. I then inserted the Caspar self-retaining retractor underneath the longus colli muscle bilaterally to provide exposure.  We then incised the intervertebral disc at C7-T1. We then performed a partial intervertebral discectomy with a pituitary forceps and the Karlin curettes. I then inserted distraction screws into the vertebral bodies at C7 and T1. We then distracted the interspace. We then used the high-speed drill to decorticate the vertebral endplates at C7-T1, to drill away the remainder of the intervertebral disc, to drill away some posterior spondylosis, and to thin out the posterior longitudinal ligament. I then incised ligament with the arachnoid knife. We then removed the ligament with a Kerrison punches undercutting the vertebral endplates and decompressing the thecal sac. We then performed foraminotomies about the bilateral C8 nerve roots. This completed the decompression at this level.  We then repeated this procedure and analogous fashion at C6-7 and C5-6, decompressing the thecal sac and the bilateral C6 and C7 nerve roots.  We now turned our to attention to the interbody fusion. We used the trial spacers to determine the appropriate size for the interbody prosthesis. We then pre-filled prosthesis with a combination of local morcellized autograft bone that we obtained during decompression as well as Kinnex bone graft extender. We then inserted the prosthesis into the distracted interspace at C5-6, C6-7 and C7-T1. We then removed the distraction screws. There was a good snug fit of the prosthesis in the  interspace.  Having completed the fusion  we now turned attention to the anterior spinal instrumentation. We used the high-speed drill to drill away some anterior spondylosis at the disc spaces so that the plate lay down flat. We selected the appropriate length titanium anterior cervical plate. We laid it along the anterior aspect of the vertebral bodies from C5-T1. We then drilled 14 mm holes at the C5, C6, C7 and T1. We then secured the plate to the vertebral bodies by placing two 14 mm self-tapping screws at C5, C6, C7 and T1. We then obtained intraoperative radiograph. The demonstrating good position of the instrumentation. We therefore secured the screws the plate the locking each cam. This completed the instrumentation.  We then obtained hemostasis using bipolar electrocautery. We irrigated the wound out with bacitracin solution. We then removed the retractor. We inspected the esophagus for any damage. There was none apparent. We then reapproximated patient's platysmal muscle with interrupted 3-0 Vicryl suture. We then reapproximated the subcutaneous tissue with interrupted 3-0 Vicryl suture. The skin was reapproximated with Steri-Strips and benzoin. The wound was then covered with bacitracin ointment. A sterile dressing was applied. The drapes were removed. Patient was subsequently extubated by the anesthesia team and transported to the post anesthesia care unit in stable condition. All sponge instrument and needle counts were reportedly correct at the end of this case.

## 2016-07-20 ENCOUNTER — Encounter (HOSPITAL_COMMUNITY): Payer: Self-pay | Admitting: Physical Medicine and Rehabilitation

## 2016-07-20 DIAGNOSIS — M4712 Other spondylosis with myelopathy, cervical region: Principal | ICD-10-CM

## 2016-07-20 DIAGNOSIS — M4722 Other spondylosis with radiculopathy, cervical region: Secondary | ICD-10-CM

## 2016-07-20 MED ORDER — CYCLOBENZAPRINE HCL 10 MG PO TABS: 10.0000 mg | ORAL_TABLET | Freq: Three times a day (TID) | ORAL | Status: DC | PRN

## 2016-07-20 MED ORDER — DOCUSATE SODIUM 100 MG PO CAPS: 100.0000 mg | ORAL_CAPSULE | Freq: Two times a day (BID) | ORAL | 0 refills | Status: DC

## 2016-07-20 MED ORDER — OXYCODONE-ACETAMINOPHEN 5-325 MG PO TABS: 1.0000 | ORAL_TABLET | ORAL | 0 refills | Status: DC | PRN

## 2016-07-20 MED ORDER — CYCLOBENZAPRINE HCL 10 MG PO TABS: 10.0000 mg | ORAL_TABLET | Freq: Three times a day (TID) | ORAL | 1 refills | Status: DC | PRN

## 2016-07-20 NOTE — Progress Notes (Signed)
I will follow up in the morning with pt and his sister to discuss his rehab venue options. [phone]

## 2016-07-20 NOTE — Evaluation (Signed)
Physical Therapy Evaluation Patient Details Name: Brian BayleyMorris D Diamond MRN: [id number] DOB: 10/25/1955 Today's Date: 07/20/2016   History of Present Illness  Pt is a 60 y/o male who presents s/p C5-T1 ACDF on 07/19/16.  Clinical Impression  This patient presents with acute pain and decreased functional independence following the above mentioned procedure. At the time of PT eval, pt was able to perform transfers and ambulation with hands-on guarding/assist for balance support and safety. This patient is appropriate for skilled PT interventions to address functional limitations, improve safety and independence with functional mobility. Pt would benefit from continued therapy at the CIR level, however if unable to transition to CIR, will require SNF.     Follow Up Recommendations CIR;Supervision/Assistance - 24 hour    Equipment Recommendations  Rolling walker with 5" wheels;3in1 (PT)    Recommendations for Other Services Rehab consult     Precautions / Restrictions Precautions Precautions: Fall;Cervical Precaution Comments: Reviewed cervical precautions. Able to verbally repeat back after education however poor follow-through during mobility.  Required Braces or Orthoses: Cervical Brace Cervical Brace: Hard collar;At all times Restrictions Weight Bearing Restrictions: No      Mobility  Bed Mobility Overal bed mobility: Needs Assistance Bed Mobility: Sit to Supine       Sit to supine: Supervision   General bed mobility comments: Pt completed EOB to supine. Pt long sitting in bed at the end of session  Transfers Overall transfer level: Needs assistance Equipment used: Rolling walker (2 wheeled) Transfers: Sit to/from Stand Sit to Stand: Min guard         General transfer comment: Hands on guarding for assistance. Pt appears unsteady when reaching for the walker.  Ambulation/Gait Ambulation/Gait assistance: Min guard Ambulation Distance (Feet): 100 Feet Assistive device:  Rolling walker (2 wheeled) Gait Pattern/deviations: Step-to pattern;Decreased stride length;Antalgic;Trunk flexed Gait velocity: Decreased Gait velocity interpretation: Below normal speed for age/gender General Gait Details: Pt with externally rotated LLE and poor floor clearance bilaterally (L worse than R). Pt fatigues quickly and requires hands-on guarding for safety with frequent cues for walker placement close to pt's body.   Stairs            Wheelchair Mobility    Modified Rankin (Stroke Patients Only)       Balance Overall balance assessment: Needs assistance Sitting-balance support: Feet supported;No upper extremity supported Sitting balance-Leahy Scale: Fair     Standing balance support: Single extremity supported;During functional activity Standing balance-Leahy Scale: Poor Standing balance comment: pt demonstrates LOB x2 in bathroom for grooming task                             Pertinent Vitals/Pain Pain Assessment: Faces Faces Pain Scale: Hurts little more Pain Location: incision Pain Descriptors / Indicators: Operative site guarding Pain Intervention(s): Monitored during session;Premedicated before session;Repositioned    Home Living Family/patient expects to be discharged to:: Inpatient rehab                      Prior Function Level of Independence: Independent with assistive device(s)         Comments: [facility] but could still perform all ADL's independently.      Hand Dominance   Dominant Hand: Right    Extremity/Trunk Assessment   Upper Extremity Assessment: Generalized weakness           Lower Extremity Assessment: Defer to PT evaluation  RLE Deficits / Details: Decreased floor clearance during ambulation. Appears that R knee remains bent most of the time during weight bearing.  LLE Deficits / Details: Notable ER of LLE and dragging foot along during ambulation. L knee appears  flexed during weight bearing as well.   Cervical / Trunk Assessment: Other exceptions  Communication   Communication: No difficulties  Cognition Arousal/Alertness: Awake/alert Behavior During Therapy: WFL for tasks assessed/performed Overall Cognitive Status: No family/caregiver present to determine baseline cognitive functioning                      General Comments      Exercises     Assessment/Plan    PT Assessment Patient needs continued PT services  PT Problem List Decreased strength;Decreased range of motion;Decreased balance;Decreased activity tolerance;Decreased mobility;Decreased knowledge of use of DME;Decreased safety awareness;Decreased knowledge of precautions;Pain          PT Treatment Interventions DME instruction;Gait training;Stair training;Functional mobility training;Therapeutic activities;Therapeutic exercise;Neuromuscular re-education;Patient/family education    PT Goals (Current goals can be found in the Care Plan section)  Acute Rehab PT Goals Patient Stated Goal: to get better PT Goal Formulation: With patient Time For Goal Achievement: 07/27/16 Potential to Achieve Goals: Good    Frequency Min 5X/week   Barriers to discharge Decreased caregiver support Pt reports he is living with family at this time but does not have a place of his own.     Co-evaluation               End of Session Equipment Utilized During Treatment: Gait belt Activity Tolerance: Patient limited by fatigue Patient left: in chair;with call bell/phone within reach Nurse Communication: Mobility status         Time: 0742-0759 PT Time Calculation (min) (ACUTE ONLY): 17 min   Charges:   PT Evaluation $PT Eval Moderate Complexity: 1 Procedure     PT G Codes:        Conni SlipperKirkman, Alyxander Kollmann 07/20/2016, 11:13 AM  Conni SlipperLaura Elisia Stepp, PT, DPT Acute Rehabilitation Services Pager: [phone]

## 2016-07-20 NOTE — Discharge Summary (Signed)
  Physician Discharge Summary  Patient ID: Darcel BayleyMorris D Eckersley MRN: [id number] DOB/AGE: 59-Sep-1957 59 y.o.  Admit date: 07/19/2016 Discharge date: 07/20/2016  Admission Diagnoses:C5-6, C6-7 and C7-T1 herniated disc, spondylosis, cervicalgia, cervical radiculopathy, cervical myelopathy  Discharge Diagnoses: The same Active Problems:   Cervical spondylosis with myelopathy and radiculopathy   Discharged Condition: good  Hospital Course: I performed a C5-6, C6-7 and C7-T1 anterior cervical discectomy, fusion, and plating on the patient. The surgery went well.  The patient's postoperative course was unremarkable. Evidently the patient is homeless. I have asked the case managers to work on disposition. I'll ask rehabilitation to see the patient to see if they think he would benefit from inpatient rehabilitation Once arrangements are made for suitable discharge he will be discharged.   Consults: Physical therapy, occupational therapy Significant Diagnostic Studies: None Treatments: C5-6, C6-7 and C7-T1 anterior cervical discectomy, fusion, and plating. Discharge Exam: Blood pressure (!) 138/100, pulse 79, temperature 98.6 F (37 C), temperature source Oral, resp. rate 18, SpO2 99 %. The patient is alert and pleasant. His dressing is clean and dry. There is no hematoma or shift. He is moving all 4 extremities well. His gait is unsteady.  Disposition: To be determined     Medication List    TAKE these medications   albuterol 108 (90 Base) MCG/ACT inhaler Commonly known as:  PROVENTIL HFA;VENTOLIN HFA Inhale 2 puffs into the lungs every 6 (six) hours as needed for wheezing or shortness of breath.   amLODipine 10 MG tablet Commonly known as:  NORVASC Take 10 mg by mouth daily.   aspirin EC 325 MG tablet Take 325 mg by mouth daily.   cyclobenzaprine 10 MG tablet Commonly known as:  FLEXERIL Take 1 tablet (10 mg total) by mouth 3 (three) times daily as needed for muscle spasms.    docusate sodium 100 MG capsule Commonly known as:  COLACE Take 1 capsule (100 mg total) by mouth 2 (two) times daily.   hydrochlorothiazide 25 MG tablet Commonly known as:  HYDRODIURIL Take 25 mg by mouth daily.   oxyCODONE-acetaminophen 5-325 MG tablet Commonly known as:  PERCOCET/ROXICET Take 1-2 tablets by mouth every 4 (four) hours as needed for moderate pain.        SignedTressie Stalker: Ripken Rekowski D 07/20/2016, 7:32 AM

## 2016-07-20 NOTE — Progress Notes (Signed)
Thank you for consult on Brian Kelley. Will await PT/OT evaluations to help determine appropriate rehab venue.

## 2016-07-20 NOTE — Discharge Instructions (Signed)
Wound Care °Leave incision open to air. °You may shower. °Do not scrub directly on incision.  °Leave steri-strips on neck.  They will fall off themselves. °Do not put any creams, lotions, or ointments on incision. °Activity °Walk each and every day, increasing distance each day. °No lifting greater than 5 lbs.  Avoid excessive neck motion. °No driving for 2 weeks; may ride as a passenger locally. °Wear neck brace at all times except when showering.  If provided soft collar, may wear for comfort unless otherwise instructed. °Diet °Resume your normal diet.  °Return to Work °Will be discussed at you follow up appointment. °Call Your Doctor If Any of These Occur °Redness, drainage, or swelling at the wound.  °Temperature greater than 101 degrees. °Severe pain not relieved by pain medication. °Increased difficulty swallowing. °Incision starts to come apart. °Follow Up Appt °Call today for appointment in 3-4 weeks ([phone]) or for problems.  If you have any hardware placed in your spine, you will need an x-ray before your appointment. ° °
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
## 2016-07-21 ENCOUNTER — Encounter (HOSPITAL_COMMUNITY): Payer: Self-pay | Admitting: Physical Medicine and Rehabilitation

## 2016-07-21 ENCOUNTER — Encounter (HOSPITAL_COMMUNITY): Payer: Self-pay | Admitting: *Deleted

## 2016-07-21 ENCOUNTER — Inpatient Hospital Stay (HOSPITAL_COMMUNITY)
Admission: RE | Admit: 2016-07-21 | Discharge: 2016-07-31 | DRG: 093 | Disposition: A | Discharge status: Home / Self Care | Payer: Medicaid Other | Source: Intra-hospital | Attending: Physical Medicine & Rehabilitation | Admitting: Physical Medicine & Rehabilitation

## 2016-07-21 DIAGNOSIS — K5903 Drug induced constipation: Secondary | ICD-10-CM

## 2016-07-21 DIAGNOSIS — N183 Chronic kidney disease, stage 3 unspecified: Secondary | ICD-10-CM

## 2016-07-21 DIAGNOSIS — F1010 Alcohol abuse, uncomplicated: Secondary | ICD-10-CM | POA: Diagnosis not present

## 2016-07-21 DIAGNOSIS — Z590 Homelessness: Secondary | ICD-10-CM | POA: Diagnosis not present

## 2016-07-21 DIAGNOSIS — F1210 Cannabis abuse, uncomplicated: Secondary | ICD-10-CM

## 2016-07-21 DIAGNOSIS — Z981 Arthrodesis status: Secondary | ICD-10-CM

## 2016-07-21 DIAGNOSIS — M5003 Cervical disc disorder with myelopathy, cervicothoracic region: Secondary | ICD-10-CM | POA: Diagnosis present

## 2016-07-21 DIAGNOSIS — F17210 Nicotine dependence, cigarettes, uncomplicated: Secondary | ICD-10-CM | POA: Diagnosis not present

## 2016-07-21 DIAGNOSIS — I129 Hypertensive chronic kidney disease with stage 1 through stage 4 chronic kidney disease, or unspecified chronic kidney disease: Secondary | ICD-10-CM | POA: Diagnosis not present

## 2016-07-21 DIAGNOSIS — Z7982 Long term (current) use of aspirin: Secondary | ICD-10-CM

## 2016-07-21 DIAGNOSIS — K5900 Constipation, unspecified: Secondary | ICD-10-CM | POA: Diagnosis not present

## 2016-07-21 DIAGNOSIS — G8918 Other acute postprocedural pain: Secondary | ICD-10-CM | POA: Diagnosis not present

## 2016-07-21 DIAGNOSIS — H9192 Unspecified hearing loss, left ear: Secondary | ICD-10-CM | POA: Diagnosis not present

## 2016-07-21 DIAGNOSIS — I10 Essential (primary) hypertension: Secondary | ICD-10-CM | POA: Diagnosis not present

## 2016-07-21 DIAGNOSIS — Z79899 Other long term (current) drug therapy: Secondary | ICD-10-CM

## 2016-07-21 DIAGNOSIS — R269 Unspecified abnormalities of gait and mobility: Secondary | ICD-10-CM | POA: Diagnosis present

## 2016-07-21 DIAGNOSIS — N189 Chronic kidney disease, unspecified: Secondary | ICD-10-CM | POA: Diagnosis not present

## 2016-07-21 DIAGNOSIS — Z87820 Personal history of traumatic brain injury: Secondary | ICD-10-CM | POA: Diagnosis not present

## 2016-07-21 DIAGNOSIS — Z419 Encounter for procedure for purposes other than remedying health state, unspecified: Secondary | ICD-10-CM

## 2016-07-21 MED ORDER — OXYCODONE-ACETAMINOPHEN 5-325 MG PO TABS
1.0000 | ORAL_TABLET | ORAL | Status: DC | PRN
  Administered 2016-07-21 – 2016-07-22 (×3): 2 via ORAL
  Administered 2016-07-23: 1 via ORAL
  Administered 2016-07-24 – 2016-07-30 (×15): 2 via ORAL
  Filled 2016-07-21 (×19): qty 2

## 2016-07-21 MED ORDER — MENTHOL 3 MG MT LOZG
1.0000 | LOZENGE | OROMUCOSAL | Status: DC | PRN
  Filled 2016-07-21: qty 9

## 2016-07-21 MED ORDER — BISACODYL 10 MG RE SUPP: 10.0000 mg | Freq: Every day | RECTAL | Status: DC | PRN

## 2016-07-21 MED ORDER — TRAMADOL HCL 50 MG PO TABS: 50.0000 mg | ORAL_TABLET | Freq: Four times a day (QID) | ORAL | Status: DC | PRN

## 2016-07-21 MED ORDER — PROCHLORPERAZINE 25 MG RE SUPP: 12.5000 mg | Freq: Four times a day (QID) | RECTAL | Status: DC | PRN

## 2016-07-21 MED ORDER — PROCHLORPERAZINE EDISYLATE 5 MG/ML IJ SOLN: 5.0000 mg | Freq: Four times a day (QID) | INTRAMUSCULAR | Status: DC | PRN

## 2016-07-21 MED ORDER — ALUM & MAG HYDROXIDE-SIMETH 200-200-20 MG/5ML PO SUSP: 30.0000 mL | Freq: Four times a day (QID) | ORAL | Status: DC | PRN

## 2016-07-21 MED ORDER — SENNOSIDES-DOCUSATE SODIUM 8.6-50 MG PO TABS
2.0000 | ORAL_TABLET | Freq: Two times a day (BID) | ORAL | Status: DC
  Administered 2016-07-21 – 2016-07-23 (×4): 2 via ORAL
  Filled 2016-07-21 (×4): qty 2

## 2016-07-21 MED ORDER — ALUM & MAG HYDROXIDE-SIMETH 200-200-20 MG/5ML PO SUSP: 30.0000 mL | ORAL | Status: DC | PRN

## 2016-07-21 MED ORDER — GUAIFENESIN-DM 100-10 MG/5ML PO SYRP
5.0000 mL | ORAL_SOLUTION | Freq: Four times a day (QID) | ORAL | Status: DC | PRN
Start: 2016-07-21 — End: 2016-07-31

## 2016-07-21 MED ORDER — PHENOL 1.4 % MT LIQD: 1.0000 | OROMUCOSAL | Status: DC | PRN

## 2016-07-21 MED ORDER — DIPHENHYDRAMINE HCL 12.5 MG/5ML PO ELIX: 12.5000 mg | ORAL_SOLUTION | Freq: Four times a day (QID) | ORAL | Status: DC | PRN

## 2016-07-21 MED ORDER — PROCHLORPERAZINE MALEATE 5 MG PO TABS: 5.0000 mg | ORAL_TABLET | Freq: Four times a day (QID) | ORAL | Status: DC | PRN

## 2016-07-21 MED ORDER — TRAZODONE HCL 50 MG PO TABS
25.0000 mg | ORAL_TABLET | Freq: Every evening | ORAL | Status: DC | PRN
  Administered 2016-07-23: 50 mg via ORAL
  Filled 2016-07-21: qty 1

## 2016-07-21 MED ORDER — AMLODIPINE BESYLATE 10 MG PO TABS
10.0000 mg | ORAL_TABLET | Freq: Every day | ORAL | Status: DC
  Administered 2016-07-22 – 2016-07-31 (×10): 10 mg via ORAL
  Filled 2016-07-21 (×10): qty 1

## 2016-07-21 MED ORDER — CYCLOBENZAPRINE HCL 10 MG PO TABS
10.0000 mg | ORAL_TABLET | Freq: Three times a day (TID) | ORAL | Status: DC | PRN
  Administered 2016-07-25: 10 mg via ORAL
  Filled 2016-07-21: qty 1

## 2016-07-21 MED ORDER — ACETAMINOPHEN 325 MG PO TABS: 325.0000 mg | ORAL_TABLET | ORAL | Status: DC | PRN

## 2016-07-21 MED ORDER — HYDROCHLOROTHIAZIDE 25 MG PO TABS
25.0000 mg | ORAL_TABLET | Freq: Every day | ORAL | Status: DC
  Administered 2016-07-22 – 2016-07-31 (×10): 25 mg via ORAL
  Filled 2016-07-21 (×10): qty 1

## 2016-07-21 MED ORDER — ONDANSETRON HCL 4 MG/2ML IJ SOLN: 4.0000 mg | INTRAMUSCULAR | Status: DC | PRN

## 2016-07-21 MED ORDER — ALBUTEROL SULFATE (2.5 MG/3ML) 0.083% IN NEBU: 2.5000 mg | INHALATION_SOLUTION | Freq: Four times a day (QID) | RESPIRATORY_TRACT | Status: DC | PRN

## 2016-07-21 MED ORDER — FLEET ENEMA 7-19 GM/118ML RE ENEM: 1.0000 | ENEMA | Freq: Once | RECTAL | Status: DC | PRN
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
## 2016-07-21 NOTE — Progress Notes (Signed)
Patient alert and oriented, mae's well, voiding adequate amount of urine, swallowing without difficulty, no c/o pain at time of discharge. Patient discharged to inpatient rehab. Discharged instructions given to patient. Patient and family stated understanding of instructions given. Patient has an appointment with Dr. Lovell SheehanJenkins.

## 2016-07-21 NOTE — Progress Notes (Signed)
I met with pt at bedside after therapy this morning to discuss an inpt rehab admission today. He is in agreement to admit. He plans to stay with his sister after d/c from CIR. I will make the arrangements to admit today. [phone]
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
## 2016-07-21 NOTE — Progress Notes (Signed)
Physical Therapy Treatment Patient Details Name: Brian Kelley MRN: [id number] DOB: 1956-07-29 Today's Date: 07/21/2016    History of Present Illness Pt is a 60 y/o male who presents s/p C5-T1 ACDF on 07/19/16.    PT Comments    Pt progressing towards physical therapy goals. Pt continues to have difficulty maintaining upright posture and advancing LLE from pre-op weakness. Pt anticipating d/c to CIR today. Will continue to follow.   Follow Up Recommendations  CIR;Supervision/Assistance - 24 hour     Equipment Recommendations  Rolling walker with 5" wheels;3in1 (PT)    Recommendations for Other Services Rehab consult     Precautions / Restrictions Precautions Precautions: Fall;Cervical Precaution Comments: Reviewed cervical precautions. Able to verbally repeat back after education however poor follow-through during mobility.  Required Braces or Orthoses: Cervical Brace Cervical Brace: Hard collar;At all times Restrictions Weight Bearing Restrictions: No    Mobility  Bed Mobility Overal bed mobility: Needs Assistance Bed Mobility: Rolling;Sidelying to Sit Rolling: Supervision Sidelying to sit: Supervision       General bed mobility comments: Pt was cued for log roll technique. Was able to complete with increased time and use of railings.   Transfers Overall transfer level: Needs assistance Equipment used: Rolling walker (2 wheeled) Transfers: Sit to/from Stand Sit to Stand: Min assist         General transfer comment: Hands on guarding for assistance. Pt appears unsteady when reaching for the walker.  Ambulation/Gait Ambulation/Gait assistance: Min assist Ambulation Distance (Feet): 150 Feet Assistive device: Rolling walker (2 wheeled) Gait Pattern/deviations: Step-to pattern;Decreased stride length;Antalgic;Trunk flexed Gait velocity: Decreased Gait velocity interpretation: Below normal speed for age/gender General Gait Details: Pt with externally rotated  LLE and poor floor clearance bilaterally (L worse than R). Pt fatigues quickly and requires hands-on guarding for safety with frequent cues for walker placement close to pt's body. Pt was stopped x3 to improve posture before continuing on.    Stairs            Wheelchair Mobility    Modified Rankin (Stroke Patients Only)       Balance Overall balance assessment: Needs assistance Sitting-balance support: Feet supported;No upper extremity supported Sitting balance-Leahy Scale: Fair     Standing balance support: No upper extremity supported;During functional activity Standing balance-Leahy Scale: Poor                      Cognition Arousal/Alertness: Awake/alert Behavior During Therapy: WFL for tasks assessed/performed Overall Cognitive Status: No family/caregiver present to determine baseline cognitive functioning                      Exercises      General Comments        Pertinent Vitals/Pain Pain Assessment: Faces Faces Pain Scale: Hurts a little bit Pain Location: Incision site Pain Descriptors / Indicators: Operative site guarding;Discomfort Pain Intervention(s): Limited activity within patient's tolerance;Monitored during session;Repositioned    Home Living   Living Arrangements: Other relatives (has lived with his sister for two weeks pta due to his mobil) Available Help at Discharge: Available PRN/intermittently (sister) Type of Home: Mobile home Home Access: Stairs to enter Entrance Stairs-Rails: Right;Left;Can reach both Home Layout: One level        Prior Function            PT Goals (current goals can now be found in the care plan section) Acute Rehab PT Goals Patient Stated Goal: to get better PT  Goal Formulation: With patient Time For Goal Achievement: 07/27/16 Potential to Achieve Goals: Good Progress towards PT goals: Progressing toward goals    Frequency    Min 5X/week      PT Plan Current plan remains  appropriate    Co-evaluation             End of Session Equipment Utilized During Treatment: Gait belt Activity Tolerance: Patient limited by fatigue Patient left: in chair;with call bell/phone within reach     Time: 0816-0832 PT Time Calculation (min) (ACUTE ONLY): 16 min  Charges:  $Gait Training: 8-22 mins                    G Codes:      Brian Kelley 07/21/2016, 11:41 AM  Brian Kelley, PT, DPT Acute Rehabilitation Services Pager: [phone]

## 2016-07-21 NOTE — Progress Notes (Signed)
Pt admitted to [phone] with belongings at side. Pt given instructions and left with call bell in reach. RN explained the fall risk precautions and edcuated patient on importance of calling for assistance. Bed alarm on and 3 siderails up

## 2016-07-22 ENCOUNTER — Inpatient Hospital Stay (HOSPITAL_COMMUNITY): Payer: Medicaid Other | Admitting: Occupational Therapy

## 2016-07-22 ENCOUNTER — Inpatient Hospital Stay (HOSPITAL_COMMUNITY): Payer: Medicaid Other

## 2016-07-22 DIAGNOSIS — I10 Essential (primary) hypertension: Secondary | ICD-10-CM

## 2016-07-22 DIAGNOSIS — M5003 Cervical disc disorder with myelopathy, cervicothoracic region: Secondary | ICD-10-CM

## 2016-07-22 DIAGNOSIS — N189 Chronic kidney disease, unspecified: Secondary | ICD-10-CM

## 2016-07-22 LAB — COMPREHENSIVE METABOLIC PANEL
ALBUMIN: 3.4 g/dL — AB (ref 3.5–5.0)
ALT: 37 U/L (ref 17–63)
ANION GAP: 6 (ref 5–15)
AST: 29 U/L (ref 15–41)
Alkaline Phosphatase: 38 U/L (ref 38–126)
BUN: 16 mg/dL (ref 6–20)
CHLORIDE: 98 mmol/L — AB (ref 101–111)
CO2: 32 mmol/L (ref 22–32)
Calcium: 9.4 mg/dL (ref 8.9–10.3)
Creatinine, Ser: 1.45 mg/dL — ABNORMAL HIGH (ref 0.61–1.24)
GFR calc Af Amer: 59 mL/min — ABNORMAL LOW (ref >60)
GFR calc non Af Amer: 51 mL/min — ABNORMAL LOW (ref >60)
GLUCOSE: 91 mg/dL (ref 65–99)
POTASSIUM: 3.9 mmol/L (ref 3.5–5.1)
SODIUM: 136 mmol/L (ref 135–145)
Total Bilirubin: 0.3 mg/dL (ref 0.3–1.2)
Total Protein: 6.6 g/dL (ref 6.5–8.1)

## 2016-07-22 LAB — CBC WITH DIFFERENTIAL/PLATELET
BASOS PCT: 0 %
Basophils Absolute: 0 10*3/uL (ref 0.0–0.1)
EOS ABS: 0.1 10*3/uL (ref 0.0–0.7)
Eosinophils Relative: 1 %
HCT: 40.2 % (ref 39.0–52.0)
Hemoglobin: 13.3 g/dL (ref 13.0–17.0)
Lymphocytes Relative: 35 %
Lymphs Abs: 2.5 10*3/uL (ref 0.7–4.0)
MCH: 24.9 pg — ABNORMAL LOW (ref 26.0–34.0)
MCHC: 33.1 g/dL (ref 30.0–36.0)
MCV: 75.3 fL — ABNORMAL LOW (ref 78.0–100.0)
MONO ABS: 0.8 10*3/uL (ref 0.1–1.0)
MONOS PCT: 11 %
NEUTROS PCT: 53 %
Neutro Abs: 3.7 10*3/uL (ref 1.7–7.7)
Platelets: 237 10*3/uL (ref 150–400)
RBC: 5.34 MIL/uL (ref 4.22–5.81)
RDW: 14 % (ref 11.5–15.5)
WBC: 7 10*3/uL (ref 4.0–10.5)

## 2016-07-22 NOTE — Progress Notes (Signed)
Occupational Therapy Session Note  Patient Details  Name: Brian Kelley MRN: [id number] Date of Birth: 1956/05/12  Today's Date: 07/22/2016 OT Individual Time: [id number] OT Individual Time Calculation (min): 27 min     Short Term Goals: Week 1:  OT Short Term Goal 1 (Week 1): STGs = LTGs due to LOS <10 days  Skilled Therapeutic Interventions/Progress Updates:  Treatment session with focus on functional mobility, activity tolerance, and dynamic standing balance.  Pt received seated at EOB.  Ambulated 152 feet with RW and min assist, min cues for upright standing posture and improved positioning to RW.  Engaged in dynamic standing balance with focus on upright standing posture while reaching outside BOS to challenge balance reactions and weight shifting.  Incorporating BUE while reaching outside BOS and then crossing midline.  Min guard - supervision for standing balance during reaching activity.  Upon return to room via w/c due to fatigue, pt reports need to toilet. Pt impulsively stood from w/c without brakes locked and leg rests still in place, required cues for safety awareness and to lock brakes and move leg rests before ambulating to toilet. Pt voided in standing at commode with supervision and then ambulated back to bed with RW and min assist.  Therapy Documentation Precautions:  Precautions Precautions: Fall, Cervical Required Braces or Orthoses: Cervical Brace Cervical Brace: Hard collar, Other (comment) (per MD order, may remove for shower and laying in bed) Restrictions Weight Bearing Restrictions: No Pain: Pain Assessment Pain Assessment: 0-10 Pain Score: 8  Pain Type: Surgical pain Pain Location: Neck Pain Descriptors / Indicators: Aching Pain Onset: On-going Pain Intervention(s):  (premedicated just prior to session, per pt)  See Function Navigator for Current Functional Status.   Therapy/Group: Individual Therapy  Rosalio LoudHOXIE, Ajay Strubel 07/22/2016, 12:29 PM
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
## 2016-07-22 NOTE — Progress Notes (Signed)
Occupational Therapy Session Note  Patient Details  Name: Brian Kelley MRN: [id number] Date of Birth: 1955/11/02  Today's Date: 07/22/2016 OT Individual Time: [id number] OT Individual Time Calculation (min): 55 min     Short Term Goals: Week 1:  OT Short Term Goal 1 (Week 1): STGs = LTGs due to LOS <10 days  Skilled Therapeutic Interventions/Progress Updates:  Pt completed supine to sit with supervision during session.  Completed short distance functional mobility with the RW and min assist out into the hallway and then back to the wheelchair in his room.  Ambulated approximately 30-40 feet.  He was able to roll his wheelchair down to the therapy gym with supervision for next part of session.  Issued green theraputty and hand pt complete exercises for grip and pinch bilaterally in sitting with min instructional cueing for trunk compensation.  Pt with greater weakness in the left hand.  Grip strength as follows:  Left =58 lbs, Right = 23 lbs.  Pt rolled back to room at end of session and attempted toilet transfer and toileting in standing to urinate with min guard assist and use of the RW.  He was unable to urinate and just returned to bed at end of session.  Pt left in bed with soft collar off per orders that it can be removed in bed and will call button and phone in reach.     Therapy Documentation Precautions:  Precautions Precautions: Fall, Cervical Required Braces or Orthoses: Cervical Brace Cervical Brace: Hard collar (collar can be removed in bed and for showers) Restrictions Weight Bearing Restrictions: No  Pain: Pain Assessment Pain Assessment: No/denies pain ADL: ADL ADL Comments: Supervision overall  See Function Navigator for Current Functional Status.   Therapy/Group: Individual Therapy  Loren Vicens OTR/L 07/22/2016, 3:54 PM
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
## 2016-07-22 NOTE — Progress Notes (Signed)
[place] PHYSICAL MEDICINE & REHABILITATION     PROGRESS NOTE    Subjective/Complaints: Had a good night. Already up with PT.   ROS: Pt denies fever, rash/itching, headache, blurred or double vision, nausea, vomiting, abdominal pain, diarrhea, chest pain, shortness of breath, palpitations, dysuria, dizziness,   bleeding, anxiety, or depression    Objective: Vital Signs: Blood pressure (!) 129/93, pulse 72, temperature 98.6 F (37 C), temperature source Oral, resp. rate 20, height 5\' 9"  (1.753 m), weight 79.7 kg (175 lb 12.8 oz), SpO2 99 %. No results found.  Recent Labs  07/22/16 0509  WBC 7.0  HGB 13.3  HCT 40.2  PLT 237    Recent Labs  07/22/16 0509  NA 136  K 3.9  CL 98*  GLUCOSE 91  BUN 16  CREATININE 1.45*  CALCIUM 9.4   CBG (last 3)  No results for input(s): GLUCAP in the last 72 hours.  Wt Readings from Last 3 Encounters:  07/21/16 79.7 kg (175 lb 12.8 oz)  07/13/16 78.7 kg (173 lb 6.4 oz)  06/15/16 79.8 kg (176 lb)    Physical Exam:  Constitutional: He is oriented to person, place, and time. He appears well-developed and well-nourished.  HENT:  Head: NCAT.  Mouth/Throat:  Oral mucosa pink and moist.  Eyes: EOMI Neck:  Dry dressing on anterior incision. Area clean.   Cardiovascular: RRR.   Respiratory: chest clear. He has no wheezes.  GI: soft NT.  Musculoskeletal: He exhibits no edema or tenderness.  Neurological: He is alert and oriented to person, place, and time. A cranial nerve deficit is present.   Decreased sensation left face with decreased hearing. Decreased LT in feet? Motor:  RUE: 4/5 prox to dist RLE: 3/5 prox to dist LUE: 4-/5 prox to distal LLE: 3-/5 prox to distal Skin: Skin is warm and dry. Marland Kitchen.  Psychiatric: He has a normal mood and affect. His behavior is normal.   Assessment/Plan: 1. Functional and mobility deficits secondary to cervical myelopathy which require 3+ hours per day of interdisciplinary therapy in a  comprehensive inpatient rehab setting. Physiatrist is providing close team supervision and 24 hour management of active medical problems listed below. Physiatrist and rehab team continue to assess barriers to discharge/monitor patient progress toward functional and medical goals.  Function:  Bathing Bathing position      Bathing parts      Bathing assist        Upper Body Dressing/Undressing Upper body dressing                    Upper body assist        Lower Body Dressing/Undressing Lower body dressing                                  Lower body assist        Toileting Toileting   Toileting steps completed by patient: Adjust clothing prior to toileting, Performs perineal hygiene, Adjust clothing after toileting   Toileting Assistive Devices: Grab bar or rail  Toileting assist Assist level: Supervision or verbal cues   Transfers Chair/bed transfer   Chair/bed transfer method: Ambulatory Chair/bed transfer assist level: No Help, no cues, assistive device, takes more than a reasonable amount of time Chair/bed transfer assistive device: Control and instrumentation engineerWalker     Locomotion Ambulation           Wheelchair  Cognition Comprehension Comprehension assist level: Understands basic 75 - 89% of the time/ requires cueing 10 - 24% of the time  Expression Expression assist level: Expresses basic 75 - 89% of the time/requires cueing 10 - 24% of the time. Needs helper to occlude trach/needs to repeat words.  Social Interaction Social Interaction assist level: Interacts appropriately 75 - 89% of the time - Needs redirection for appropriate language or to initiate interaction.  Problem Solving Problem solving assist level: Solves basic 75 - 89% of the time/requires cueing 10 - 24% of the time  Memory Memory assist level: Recognizes or recalls 75 - 89% of the time/requires cueing 10 - 24% of the time   Medical Problem List and Plan: 1.  Neurologic gait  abnormality, and difficulty with transfers secondary to cervical stenosis with myelopathy.  -beginning therapies 2.  DVT Prophylaxis/Anticoagulation: scd/ambulation    3. Pain Management: continue on percocet prn for pain control.  4. Mood: LCSW to follow for evaluation and support.  5. Neuropsych: This patient is capable of making decisions on  his own behalf. 6. Skin/Wound Care: intact. Encourage adequate nutrition 7. Fluids/Electrolytes/Nutrition:   -I personally reviewed the patient's labs today.   -encourage PO, Cr creeping up---recheck tomorrow. 8. HTN: Monitor BP bid--continue HCTZ and Norvasc daily.  -fair control  9. Constipation:   Senna S at bedtime daily.   10. CKD?: Avoid nephrotoxic medications.   -follow up bmet tomorrow   LOS (Days) 1 A FACE TO FACE EVALUATION WAS PERFORMED  Neidra Girvan T 07/22/2016 9:02 AM

## 2016-07-22 NOTE — Progress Notes (Signed)
Patient information reviewed and entered into eRehab system by Janki Dike, RN, CRRN, PPS Coordinator.  Information including medical coding and functional independence measure will be reviewed and updated through discharge.    

## 2016-07-23 ENCOUNTER — Inpatient Hospital Stay (HOSPITAL_COMMUNITY): Payer: Medicaid Other

## 2016-07-23 ENCOUNTER — Inpatient Hospital Stay (HOSPITAL_COMMUNITY): Payer: Medicaid Other | Admitting: Physical Therapy

## 2016-07-23 DIAGNOSIS — N183 Chronic kidney disease, stage 3 (moderate): Secondary | ICD-10-CM

## 2016-07-23 MED ORDER — POLYETHYLENE GLYCOL 3350 17 G PO PACK
17.0000 g | PACK | Freq: Two times a day (BID) | ORAL | Status: DC
  Administered 2016-07-23 – 2016-07-27 (×7): 17 g via ORAL
  Filled 2016-07-23 (×8): qty 1

## 2016-07-23 MED ORDER — MAGNESIUM HYDROXIDE 400 MG/5ML PO SUSP
30.0000 mL | Freq: Once | ORAL | Status: AC
  Administered 2016-07-23: 30 mL via ORAL
  Filled 2016-07-23: qty 30
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
## 2016-07-23 NOTE — Progress Notes (Signed)
[facility] Individual Statement of Services  Patient Name:  Brian Kelley  Date:  07/23/2016  Welcome [facility] the [facility].  Our goal is to provide you with an individualized program based on your diagnosis and situation, designed to meet your specific needs.  With this comprehensive rehabilitation program, you will be expected to participate in at least 3 hours of rehabilitation therapies Monday-Friday, with modified therapy programming on the weekends.  Your rehabilitation program will include the following services:  Physical Therapy (PT), Occupational Therapy (OT), 24 hour per day rehabilitation nursing, Case Management (Social Worker), Rehabilitation Medicine, Nutrition Services and Pharmacy Services  Weekly team conferences will be held on Tuesdays to discuss your progress.  Your Social Worker will talk with you frequently to get your input and to update you on team discussions.  Team conferences with you and your family in attendance may also be held.  Expected length of stay: 10 to 12 days  Overall anticipated outcome: Modified independent with supervision for stairs and community ambulation  Depending on your progress and recovery, your program may change. Your Social Worker will coordinate services and will keep you informed of any changes. Your Social Worker's name and contact numbers are listed  below.  The following services may also be recommended but are not provided by the [facility]:   Driving Evaluations  Home Health Rehabiltiation Services  Outpatient Rehabilitation Services  Vocational Rehabilitation   Arrangements will be made to provide these services after discharge if needed.  Arrangements include referral to agencies that provide these services.  Your insurance has been verified to be:  Medicaid Your primary doctor is:  Dr. Docia ChuckAdrain Mancheno  Pertinent information will be shared with your doctor and your  insurance company.  Social Worker:  Brian AcostaJenny Josepha Barbier, LCSW  [phone] or [phone]) [phone]  Information discussed with and copy given to patient by: Brian Kelley, Brian Kelley, 07/23/2016, 9:56 AM

## 2016-07-23 NOTE — Progress Notes (Signed)
Physical Therapy Session Note  Patient Details  Name: Brian Kelley D Lamica MRN: [id number] Date of Birth: June 20, 1956  Today's Date: 07/23/2016 PT Individual Time: 0800-0900 and 1400-1415 PT Individual Time Calculation (min): 60 min and 15 min (total 75 min)    Short Term Goals: Week 1:  PT Short Term Goal 1 (Week 1): Pt will complete transfers with supervision PT Short Term Goal 2 (Week 1): Pt will be able to gait x 150' with supervision with LRAD PT Short Term Goal 3 (Week 1): Pt will demonstrate functional increase in strength in LLE (foot clearance) or determine if there is a need for bracing.   Skilled Therapeutic Interventions/Progress Updates:   Tx 1: Pt received supine in bed, c/o generalized neck pain but does not rate as he reports he recently received medication, otherwise agreeable to treatment. Cervical collar donned in supine totalA. Supine>sit and donning pants performed with sit <>stand with S overall from flat bed. Stand pivot transfer to w/c with min guard. W/c propulsion 2x175' with BUE and S, increased speed due to UE strength deficits and occasional short rest breaks. Gait on ramp and uneven surface with RW and S; min cues for upright posture and use of AD with pt initially reaching for rail with BUEs with significantly forward flexed posture. Stand pivot transfer w/c >mat table with RW and S. Sit <>stand no UEs 2x5 from edge of mat table, occasional use of back of legs on table for balance. Alternating toe taps to 3" step with min/modA and occasional LOBs with pt reaching for table with RUE, 2x10 reps with improved performance second trial. Assessed Berg with details as below. Patient demonstrates increased fall risk as noted by score of 22/56 on Berg Balance Scale.  (<36= high risk for falls, close to 100%; 37-45 significant >80%; 46-51 moderate >50%; 52-55 lower >25%). Pt educated in WinslowBerg score, high risk for falls, and purpose of performing test as baseline assessment to determine  impairments and future treatment interventions. Pt perseverative throughout session on how far he has to be able to walk each day to get medications, how he has noone to provide transportation, and how he needs "hopefully a few years here to get better". Performed nustep x8 min total with BLE level 2 with cues to maintain >30 steps/min. Gait with RW and S x160'; note significant foot drag BLE, L>R and occasional L knee recurvatum, and BLE hip ER. Ambulated into bathroom with RW and S, performed clothing management and hygiene modI. Remained seated on toilet at end of session, verbalizes understanding to use call bell and not get up alone; NT alerted to pt position.   Tx 2: Pt received asleep in bed, denies pain and agreeable to treatment. Supine>sit with S using bedrails from flat bed. Gait x100' with RW and min guard; unable to correct ER in L hip with cueing, reports "it's always been like that". Standing heel raises 2x15 with BUE support on RW. Returned to supine with S at end of session, all needs in reach.   Therapy Documentation Precautions:  Precautions Precautions: Fall, Cervical Required Braces or Orthoses: Cervical Brace Cervical Brace: Hard collar (collar can be removed in bed and for showers) Restrictions Weight Bearing Restrictions: No Balance: Standardized Balance Assessment Standardized Balance Assessment: Berg Balance Test Berg Balance Test Sit to Stand: Able to stand  independently using hands Standing Unsupported: Able to stand 2 minutes with supervision Sitting with Back Unsupported but Feet Supported on Floor or Stool: Able to sit safely  and securely 2 minutes Stand to Sit: Sits independently, has uncontrolled descent Transfers: Able to transfer with verbal cueing and /or supervision Standing Unsupported with Eyes Closed: Able to stand 10 seconds with supervision Standing Ubsupported with Feet Together: Able to place feet together independently and stand for 1 minute with  supervision From Standing, Reach Forward with Outstretched Arm: Reaches forward but needs supervision From Standing Position, Pick up Object from Floor: Unable to try/needs assist to keep balance From Standing Position, Turn to Look Behind Over each Shoulder: Needs assist to keep from losing balance and falling Turn 360 Degrees: Needs assistance while turning Standing Unsupported, Alternately Place Feet on Step/Stool: Needs assistance to keep from falling or unable to try Standing Unsupported, One Foot in Front: Able to take small step independently and hold 30 seconds Standing on One Leg: Unable to try or needs assist to prevent fall Total Score: 22   See Function Navigator for Current Functional Status.   Therapy/Group: Individual Therapy  Vista Lawmanlizabeth J Tygielski 07/23/2016, 8:58 AM

## 2016-07-23 NOTE — Progress Notes (Signed)
Physical Therapy Session Note  Patient Details  Name: Brian Kelley MRN: [id number] Date of Birth: [birth year]  Today's Date: 07/23/2016 PT Individual Time: 3419-3790 PT Individual Time Calculation (min): 60 min    Short Term Goals: Week 1:  PT Short Term Goal 1 (Week 1): Pt will complete transfers with supervision PT Short Term Goal 2 (Week 1): Pt will be able to gait x 150' with supervision with LRAD PT Short Term Goal 3 (Week 1): Pt will demonstrate functional increase in strength in LLE (foot clearance) or determine if there is a need for bracing.   Skilled Therapeutic Interventions/Progress Updates:   Pt presented in bed, supine to sit supervision with bed flat. Ambulated with RW 44f with noted decreased bilateral foot clearance and L ER. Pt able to demonstrate small improvement with verbal cues. Performed balance/coordination activities, toe taps to target with RW support. 2x10 bilaterally.  Performed alternating toe taps with HHA, x10.  Gait on compliant surface with RW with supervision. Pt ascend/decend x8 steps with 1 rail with cues for increased LE use and to avoid excessive pulling with UE 2/2 cervical precautions. Gait 1044fwith RW returning to room with verbal cues for posture and for increased bilateral foot clearance with pt was able to demonstrate. Pt returned to room and transferred to bed with supervision. Bed alarm set and pt left in bed with all needs met.   Therapy Documentation Precautions:  Precautions Precautions: Fall, Cervical Required Braces or Orthoses: Cervical Brace Cervical Brace: Hard collar (collar can be removed in bed and for showers) Restrictions Weight Bearing Restrictions: No General:   Vital Signs:   Pain:   Mobility:   Locomotion :    Trunk/Postural Assessment :    Balance: Standardized Balance Assessment Standardized Balance Assessment: Berg Balance Test Berg Balance Test Sit to Stand: Able to stand  independently using  hands Standing Unsupported: Able to stand 2 minutes with supervision Sitting with Back Unsupported but Feet Supported on Floor or Stool: Able to sit safely and securely 2 minutes Stand to Sit: Sits independently, has uncontrolled descent Transfers: Able to transfer with verbal cueing and /or supervision Standing Unsupported with Eyes Closed: Able to stand 10 seconds with supervision Standing Ubsupported with Feet Together: Able to place feet together independently and stand for 1 minute with supervision From Standing, Reach Forward with Outstretched Arm: Reaches forward but needs supervision From Standing Position, Pick up Object from Floor: Unable to try/needs assist to keep balance From Standing Position, Turn to Look Behind Over each Shoulder: Needs assist to keep from losing balance and falling Turn 360 Degrees: Needs assistance while turning Standing Unsupported, Alternately Place Feet on Step/Stool: Needs assistance to keep from falling or unable to try Standing Unsupported, One Foot in Front: Able to take small step independently and hold 30 seconds Standing on One Leg: Unable to try or needs assist to prevent fall Total Score: 22 Exercises:   Other Treatments:     See Function Navigator for Current Functional Status.   Therapy/Group: Individual Therapy  Aloysius Heinle  Nneoma Harral, PTA  07/23/2016, 12:23 PM

## 2016-07-23 NOTE — Progress Notes (Signed)
[facility]     PROGRESS NOTE    Subjective/Complaints: Complains of right arm pain. Aching /burning in nature  ROS: Pt denies fever, rash/itching, headache, blurred or double vision, nausea, vomiting, abdominal pain, diarrhea, chest pain, shortness of breath, palpitations, dysuria, dizziness,   bleeding, anxiety, or depression     Objective: Vital Signs: Blood pressure 120/82, pulse 73, temperature 98.6 F (37 C), temperature source Oral, resp. rate 17, height 5\' 9"  (1.753 m), weight 79.7 kg (175 lb 12.8 oz), SpO2 97 %. No results found.  Recent Labs  07/22/16 0509  WBC 7.0  HGB 13.3  HCT 40.2  PLT 237    Recent Labs  07/22/16 0509  NA 136  K 3.9  CL 98*  GLUCOSE 91  BUN 16  CREATININE 1.45*  CALCIUM 9.4   CBG (last 3)  No results for input(s): GLUCAP in the last 72 hours.  Wt Readings from Last 3 Encounters:  07/21/16 79.7 kg (175 lb 12.8 oz)  07/13/16 78.7 kg (173 lb 6.4 oz)  06/15/16 79.8 kg (176 lb)    Physical Exam:  Constitutional: He is oriented to person, place, and time. He appears well-developed and well-nourished.  HENT:  Head: Pollock  Mouth/Throat:  Oral mucosa pink and moist.  Eyes: EOMI Neck:  Incision clean   Cardiovascular:RRR   Respiratory: chest clear. He has no wheezes.  GI: BS+  Musculoskeletal: mild pain with palp of right tricep area.  Neurological: He is alert and oriented to person, place, and time. A cranial nerve deficit is present.   Decreased sensation left face with decreased hearing. Decreased LT in feet still present Motor:  RUE: 4/5 prox to dist--?pain inhibition right arm RLE: 4/5 prox to dist LUE: 4-/5 prox to distal LLE: 3-/5 prox to distal Skin: Skin is warm and dry. Marland Kitchen.  Psychiatric: He has a normal mood and affect. His behavior is normal.   Assessment/Plan: 1. Functional and mobility deficits secondary to cervical myelopathy which require 3+ hours per day of interdisciplinary  therapy in a comprehensive inpatient rehab setting. Physiatrist is providing close team supervision and 24 hour management of active medical problems listed below. Physiatrist and rehab team continue to assess barriers to discharge/monitor patient progress toward functional and medical goals.  Function:  Bathing Bathing position   Position: Shower  Bathing parts Body parts bathed by patient: Right arm, Left arm, Chest, Abdomen, Front perineal area, Buttocks, Right upper leg, Left upper leg, Right lower leg, Left lower leg, Back    Bathing assist Assist Level: Supervision or verbal cues      Upper Body Dressing/Undressing Upper body dressing   What is the patient wearing?: Pull over shirt/dress, Orthosis     Pull over shirt/dress - Perfomed by patient: Thread/unthread right sleeve, Thread/unthread left sleeve, Put head through opening, Pull shirt over trunk       Orthosis activity level: Performed by helper  Upper body assist Assist Level: Set up      Lower Body Dressing/Undressing Lower body dressing   What is the patient wearing?: Underwear, Pants, Non-skid slipper socks, Shoes Underwear - Performed by patient: Thread/unthread right underwear leg, Thread/unthread left underwear leg, Pull underwear up/down   Pants- Performed by patient: Thread/unthread right pants leg, Thread/unthread left pants leg, Pull pants up/down   Non-skid slipper socks- Performed by patient: Don/doff right sock, Don/doff left sock       Shoes - Performed by patient: Don/doff right shoe, Don/doff left shoe, Fasten  right, Fasten left            Lower body assist Assist for lower body dressing: Supervision or verbal cues      Toileting Toileting   Toileting steps completed by patient: Adjust clothing prior to toileting, Performs perineal hygiene, Adjust clothing after toileting   Toileting Assistive Devices: Grab bar or rail  Toileting assist Assist level: Touching or steadying assistance  (Pt.75%)   Transfers Chair/bed transfer   Chair/bed transfer method: Stand pivot Chair/bed transfer assist level: Touching or steadying assistance (Pt > 75%) Chair/bed transfer assistive device: Armrests     Locomotion Ambulation     Max distance: 9265'     Wheelchair   Type: Manual Max wheelchair distance: 20' Assist Level: Supervision or verbal cues (primary ambulator)  Cognition Comprehension Comprehension assist level: Follows basic conversation/direction with extra time/assistive device  Expression Expression assist level: Expresses basic needs/ideas: With extra time/assistive device  Social Interaction Social Interaction assist level: Interacts appropriately 90% of the time - Needs monitoring or encouragement for participation or interaction.  Problem Solving Problem solving assist level: Solves basic 90% of the time/requires cueing < 10% of the time  Memory Memory assist level: Recognizes or recalls 90% of the time/requires cueing < 10% of the time   Medical Problem List and Plan: 1.  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  -continue CIR therapies 2.  DVT Prophylaxis/Anticoagulation: scd/ambulation    3. Pain Management: continue on percocet prn for pain control.   -heat/ice to right tricep area. ?mild strain 4. Mood: LCSW to follow for evaluation and support.  5. Neuropsych: This patient is capable of making decisions on  his own behalf. 6. Skin/Wound Care: intact. Encourage adequate nutrition 7. Fluids/Electrolytes/Nutrition:   -encouraging fluids 8. HTN: Monitor BP bid--continue HCTZ and Norvasc daily.  -fair control at present 9. Constipation:   Senna S at bedtime daily.   10. CKD?: Avoiding nephrotoxic medications.   - re-check BMET on Monday   LOS (Days) 2 A FACE TO FACE EVALUATION WAS PERFORMED  Brian Kelley T 07/23/2016 8:42 AM

## 2016-07-23 NOTE — Progress Notes (Signed)
Occupational Therapy Session Note  Patient Details  Name: Brian Kelley MRN: [id number] Date of Birth: 05-Nov-1955  Today's Date: 07/23/2016 OT Individual Time: 1100-1200 OT Individual Time Calculation (min): 60 min     Short Term Goals: Week 1:  OT Short Term Goal 1 (Week 1): STGs = LTGs due to LOS <10 days  Skilled Therapeutic Interventions/Progress Updates:    Pt resting in bed upon arrival.  Pt stated he didn't need or want to shower or change clothing today.  Pt agreeable to participating in therapy.  Pt propelled w/c to therapy gym and engaged in BUE therapeutic activities with theraputty and manipulation of small objects.  Pt also engaged in 10 mins on UE ergomenter.  Pt engaged in functional amb with RW to locate and retrieve objects in gym.  Pt propelled w/c back to room, attempted to void while standing, and returned to bed with all needs within reach and bed alarm activated.  Focus on activity tolerance, increased BUE use, FMC and gross motor tasks, standing balance, functional amb with RW, and safety awareness to increase independence with BADLs.  Therapy Documentation Precautions:  Precautions Precautions: Fall, Cervical Required Braces or Orthoses: Cervical Brace Cervical Brace: Hard collar (collar can be removed in bed and for showers) Restrictions Weight Bearing Restrictions: No   Pain:  Pt c/o numbness in in BLE and R hand; RN aware ADL: ADL ADL Comments: Supervision overall  See Function Navigator for Current Functional Status.   Therapy/Group: Individual Therapy  Rich BraveLanier, Julliana Whitmyer Chappell 07/23/2016, 3:01 PM

## 2016-07-24 ENCOUNTER — Inpatient Hospital Stay (HOSPITAL_COMMUNITY): Payer: Medicaid Other | Admitting: Physical Therapy

## 2016-07-24 ENCOUNTER — Inpatient Hospital Stay (HOSPITAL_COMMUNITY): Payer: Medicaid Other | Admitting: *Deleted

## 2016-07-24 NOTE — Progress Notes (Signed)
Physical Therapy Session Note  Patient Details  Name: Brian BayleyMorris D Gomer MRN: [id number] Date of Birth: May 09, 1956  Today's Date: 07/24/2016 PT Individual Time: 1330-1500 PT Individual Time Calculation (min): 90 min    Short Term Goals:Week 1:  PT Short Term Goal 1 (Week 1): Pt will complete transfers with supervision PT Short Term Goal 2 (Week 1): Pt will be able to gait x 150' with supervision with LRAD PT Short Term Goal 3 (Week 1): Pt will demonstrate functional increase in strength in LLE (foot clearance) or determine if there is a need for bracing.   Skilled Therapeutic Interventions/Progress Updates:  Tx focused on functional mobility training, gait with RW, and NMR via forced use, manual facilitation, and multi-modal cues for LLE motor control. Pt resting in bed upon arrival, agreeabale to PT. Supine>sit without logroll. Pt educated on neck precautions, but he is unconcerned. Pt needing to use bathroom. Gait in/around room with close S/min-guard and safety cues. Gait in controlled setting x100' with close S and cues for technique, but pt resistant to any cues for improved safety. WC propulsion 2x100' with bil UEs desipte cue for using bil LEs for increased strength.   Kinetron x674min sitting at 40cm/s and 2x302min standing for increased strengthening in optimally aligned position.  Dynamic gait and balance at hall rail for side-stepping focusing on LE alignment 2x25' with min-guard A and cues fo rtechnique.  Step-overs R/L in // bars 2x10 each with good performance and foot clearance. Pt educated om importance of replicating this technique during gait for safety and fall risk reduction. No noticeable change in gait speed following task however.   Ball toss for dynamic standing balance occasional UE support x688min with added cognitive task of alphabet food naming game. Pt needed Min/mod cognitive assist for continuing alphabet and naming foods. Min A for standing balance with multi-modal cues for  even weight-shifting.  Horseshoe toss with no UE support and repeating alphabet food game with 50% improvement in naming and following letters.  Pt left up in bed with all needs inreach. Pt educated and reminded on imporantce of having tight-fitting C-collar and following all precautiosn as well as attempting therapy recommendations for mobility to improve strength an dsafety beyond past/current state.   Therapy Documentation Precautions:  Precautions Precautions: Fall, Cervical Required Braces or Orthoses: Cervical Brace Cervical Brace: Hard collar (collar can be removed in bed and for showers) Restrictions Weight Bearing Restrictions: No General:   Vital Signs: Therapy Vitals BP: 126/87 Pain: none   See Function Navigator for Current Functional Status.   Therapy/Group: Individual Therapy  Camary Sosa Virl CageyM  Lex Linhares, PT, DPT  07/24/2016, 1:43 PM

## 2016-07-24 NOTE — Progress Notes (Signed)
[place] PHYSICAL MEDICINE & REHABILITATION     PROGRESS NOTE    Subjective/Complaints: Right arm still a little sore. Slept well.  ROS: Pt denies fever, rash/itching, headache, blurred or double vision, nausea, vomiting, abdominal pain, diarrhea, chest pain, shortness of breath, palpitations, dysuria, dizziness,   bleeding, anxiety, or depression      Objective: Vital Signs: Blood pressure 103/73, pulse 78, temperature 98.6 F (37 C), temperature source Oral, resp. rate 18, height 5\' 9"  (1.753 m), weight 79.7 kg (175 lb 12.8 oz), SpO2 98 %. No results found.  Recent Labs  07/22/16 0509  WBC 7.0  HGB 13.3  HCT 40.2  PLT 237    Recent Labs  07/22/16 0509  NA 136  K 3.9  CL 98*  GLUCOSE 91  BUN 16  CREATININE 1.45*  CALCIUM 9.4   CBG (last 3)  No results for input(s): GLUCAP in the last 72 hours.  Wt Readings from Last 3 Encounters:  07/21/16 79.7 kg (175 lb 12.8 oz)  07/13/16 78.7 kg (173 lb 6.4 oz)  06/15/16 79.8 kg (176 lb)    Physical Exam:  Constitutional: He is oriented to person, place, and time. He appears well-developed and well-nourished.  HENT:  Head: [place]  Mouth/Throat:  Oral mucosa pink and moist.  Eyes: EOMI Neck:  Incision clean   Cardiovascular:RRR   Respiratory: cta b.  GI: BS+  Musculoskeletal: slight pain with palp of right tricep area.  Neurological: He is alert and oriented to person, place, and time. A cranial nerve deficit is present.   Decreased sensation left face with decreased hearing. Decreased LT in feet still present Motor:  RUE: 4/5 prox to dist--mild pain inhibition right arm RLE: 4/5 prox to dist LUE: 4-/5 prox to distal LLE: 3-/5 prox to distal Skin: Skin is warm and dry. Marland Kitchen.  Psychiatric: He has a normal mood and affect. His behavior is normal.   Assessment/Plan: 1. Functional and mobility deficits secondary to cervical myelopathy which require 3+ hours per day of interdisciplinary therapy in a comprehensive  inpatient rehab setting. Physiatrist is providing close team supervision and 24 hour management of active medical problems listed below. Physiatrist and rehab team continue to assess barriers to discharge/monitor patient progress toward functional and medical goals.  Function:  Bathing Bathing position   Position: Shower  Bathing parts Body parts bathed by patient: Right arm, Left arm, Chest, Abdomen, Front perineal area, Buttocks, Right upper leg, Left upper leg, Right lower leg, Left lower leg, Back    Bathing assist Assist Level: Supervision or verbal cues      Upper Body Dressing/Undressing Upper body dressing   What is the patient wearing?: Pull over shirt/dress, Orthosis     Pull over shirt/dress - Perfomed by patient: Thread/unthread right sleeve, Thread/unthread left sleeve, Put head through opening, Pull shirt over trunk       Orthosis activity level: Performed by helper  Upper body assist Assist Level: Set up      Lower Body Dressing/Undressing Lower body dressing   What is the patient wearing?: Pants, Underwear Underwear - Performed by patient: Pull underwear up/down   Pants- Performed by patient: Pull pants up/down, Fasten/unfasten pants   Non-skid slipper socks- Performed by patient: Don/doff right sock, Don/doff left sock       Shoes - Performed by patient: Don/doff right shoe, Don/doff left shoe, Fasten right, Fasten left            Lower body assist Assist for lower body  dressing: Supervision or verbal cues      Toileting Toileting   Toileting steps completed by patient: Adjust clothing prior to toileting, Adjust clothing after toileting, Performs perineal hygiene   Toileting Assistive Devices: Grab bar or rail  Toileting assist Assist level: Supervision or verbal cues   Transfers Chair/bed transfer   Chair/bed transfer method: Ambulatory Chair/bed transfer assist level: Supervision or verbal cues Chair/bed transfer assistive device: Walker,  Designer, fashion/clothingArmrests     Locomotion Ambulation     Max distance: 160 Assist level: Supervision or verbal cues   Wheelchair   Type: Manual Max wheelchair distance: 20' Assist Level: Supervision or verbal cues (primary ambulator)  Cognition Comprehension Comprehension assist level: Follows basic conversation/direction with extra time/assistive device  Expression Expression assist level: Expresses basic needs/ideas: With extra time/assistive device  Social Interaction Social Interaction assist level: Interacts appropriately 90% of the time - Needs monitoring or encouragement for participation or interaction.  Problem Solving Problem solving assist level: Solves basic 90% of the time/requires cueing < 10% of the time  Memory Memory assist level: Recognizes or recalls 90% of the time/requires cueing < 10% of the time   Medical Problem List and Plan: 1.  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  -continue PT/OT 2.  DVT Prophylaxis/Anticoagulation: scd/ambulation    3. Pain Management: continue on percocet prn for pain control.   -continue heat/ice to right tricep area. ?mild strain 4. Mood: LCSW to follow for evaluation and support.  5. Neuropsych: This patient is capable of making decisions on  his own behalf. 6. Skin/Wound Care: intact. Encourage adequate nutrition 7. Fluids/Electrolytes/Nutrition:   -push fluids 8. HTN: Monitor BP bid--continue HCTZ and Norvasc daily.  -fair control at present 9. Constipation:   Senna S at bedtime daily.   10. CKD?: Avoiding nephrotoxic medications.   - re-check BMET on Monday   LOS (Days) 3 A FACE TO FACE EVALUATION WAS PERFORMED  Cartier Mapel T 07/24/2016 8:25 AM

## 2016-07-24 NOTE — IPOC Note (Signed)
Overall Plan of Care [facility](IPOC) Patient Details Name: Brian Kelley MRN: [id number] DOB: 1956-02-16  Admitting Diagnosis: Cervical Myelopathy  Hospital Problems: Principal Problem:   Cervical disc disorder with myelopathy, cervicothoracic region Active Problems:   Benign essential HTN   Stage 3 chronic kidney disease     Functional Problem List: Nursing Bowel, Endurance, Medication Management, Motor, Pain, Sensory, Safety, Skin Integrity  PT Balance, Endurance, Motor, Pain, Safety, Sensory, Skin Integrity  OT Balance, Motor, Sensory  SLP    TR Endurance, Motor, Safety       Basic ADL's: OT Bathing, Dressing, Toileting, Grooming     Advanced  ADL's: OT Laundry     Transfers: PT Bed Mobility, Bed to Chair, Set designerCar, Occupational psychologisturniture  OT Toilet, Research scientist (life sciences)Tub/Shower     Locomotion: PT Ambulation, Stairs     Additional Impairments: OT None  SLP        TR      Anticipated Outcomes Item Anticipated Outcome  Self Feeding Independent  Swallowing      Basic self-care  Mod Independent  Toileting  Mod Independent   Bathroom Transfers Mod Independent  Bowel/Bladder  Pt will manage bowel and bladder with min assist upon discharge.   Transfers  mod I  Locomotion  mod I household distances; supervision community and stairs  Communication     Cognition     Pain  Pt will rate pain at 4 or less on a scale of 0-10.   Safety/Judgment  Pt will remain free of falls and injury with min assist.    Therapy Plan: PT Intensity: Minimum of 1-2 x/day ,45 to 90 minutes PT Frequency: 5 out of 7 days PT Duration Estimated Length of Stay: 10-12 days OT Intensity: Minimum of 1-2 x/day, 45 to 90 minutes OT Frequency: 5 out of 7 days OT Duration/Estimated Length of Stay: 10 days         Team Interventions: Nursing Interventions Patient/Family Education, Bowel Management, Pain Management, Medication Management, Skin Care/Wound Management, Discharge Planning  PT interventions Ambulation/gait  training, Balance/vestibular training, Community reintegration, Discharge planning, Disease management/prevention, DME/adaptive equipment instruction, Functional mobility training, Neuromuscular re-education, Pain management, Patient/family education, Psychosocial support, Skin care/wound management, Splinting/orthotics, Stair training, Therapeutic Activities, Therapeutic Exercise, UE/LE Strength taining/ROM, UE/LE Coordination activities, Wheelchair propulsion/positioning  OT Interventions Warden/rangerBalance/vestibular training, Discharge planning, DME/adaptive equipment instruction, Neuromuscular re-education, Functional mobility training, Self Care/advanced ADL retraining, UE/LE Strength taining/ROM, Therapeutic Exercise, Therapeutic Activities  SLP Interventions    TR Interventions    SW/CM Interventions Discharge Planning, Psychosocial Support, Patient/Family Education    Team Discharge Planning: Destination: PT-Home ,OT- Home , SLP-  Projected Follow-up: PT-Home health PT, OT-  None, SLP-  Projected Equipment Needs: PT-Rolling walker with 5" wheels, OT- Tub/shower seat, SLP-  Equipment Details: PT- , OT-  Patient/family involved in discharge planning: PT- Patient,  OT-Patient, SLP-   MD ELOS: 10-12 days Medical Rehab Prognosis:  Excellent Assessment: The patient has been admitted for CIR therapies with the diagnosis of cervical myelopathy. The team will be addressing functional mobility, strength, stamina, balance, safety, adaptive techniques and equipment, self-care, bowel and bladder mgt, patient and caregiver education, NMR, orthotics, community reintegration, ego support. Goals have been set at mod I for mobility and all self-care tasks.    Ranelle OysterZachary T. Rayola Everhart, MD, FAAPMR      See Team Conference Notes for weekly updates to the plan of care

## 2016-07-24 NOTE — Progress Notes (Addendum)
Physical Therapy Session Note  Patient Details  Name: Brian Kelley MRN: [id number] Date of Birth: [birth year]  Today's Date: 07/24/2016 PT Individual Time: 6160-7371 PT Individual Time Calculation (min): 90 min    Short Term Goals: Week 1:  PT Short Term Goal 1 (Week 1): Pt will complete transfers with supervision PT Short Term Goal 2 (Week 1): Pt will be able to gait x 150' with supervision with LRAD PT Short Term Goal 3 (Week 1): Pt will demonstrate functional increase in strength in LLE (foot clearance) or determine if there is a need for bracing.       Therapy Documentation Precautions:  Precautions Precautions: Fall, Cervical Required Braces or Orthoses: Cervical Brace Cervical Brace: Hard collar (collar can be removed in bed and for showers) Restrictions Weight Bearing Restrictions: No General:   Vital Signs: Therapy Vitals BP: 126/87 Pain: Patient denies any pain, states he just received pain medicine.   Sit to and from stand transfer close supervision with RW  Stand pivot transfer with RW close supervision.  Siot to and from stand transfer block practice 10x close supervision verbal cues for hand placement and controlled descent   PAtient ambulated 45 feetx2 and 135 feet with RW close supervision with intermittant min assist. Patient ambulated with a step through gait pattern. Patient requires verbal cues for upright posture, step length and RW management. Min assist for Manual facilitation for LLE foot clearance and step length. Patient demonstrATES IMPROVED STEP LENGTH AND CLEARANCE AFTER MANUAL FACILITATION   Patient up and down 12 steps with bilateral handrail min assist progressing toward close supervision. Patient educated on proper sequence and technique. Verbal cues for proper foot placement on stairs and sequence.   Nu-Step 8 minutes level 5  Patient performed retrograde walking 25 feet without assistive device min to mod assist for balance and  facilitation for hip extension.   Patient performed car transfer with RW close supervision. Verbal cues for hand placement and sequencing.  Patient up and down 6 inch curb with RW min assist. Patient required verbal cues for RW Management with LOB secondary to poor placement with RW.   Standing ther ex: B heel raises 3x10 with RW Min Squats 3x10 with RW.  Patient stood without assistive device min assist to mod assist perform alternating BLE cone taps. Focus on unilateral stance and balance and LE coordination.   Patient ambulated with RW close supervision into bathroom; patient continent of bladder during session. Patient stood to urinate with close supervision.   Patient returned to room at end of session with all needs met and quick release belt engaged seated in wheelchair. Patient educated not to be up without assistance, patient in agreement with recommendation.    See Function Navigator for Current Functional Status.   Therapy/Group: Individual Therapy  Retta Diones 07/24/2016, 10:52 AM

## 2016-07-25 NOTE — Progress Notes (Signed)
[place] PHYSICAL MEDICINE & REHABILITATION     PROGRESS NOTE    Subjective/Complaints: Slept well. Pain improved.   ROS: Pt denies fever, rash/itching, headache, blurred or double vision, nausea, vomiting, abdominal pain, diarrhea, chest pain, shortness of breath, palpitations, dysuria, dizziness, neck pain, back pain, bleeding, anxiety, or depression      Objective: Vital Signs: Blood pressure 109/86, pulse 74, temperature 98 F (36.7 C), temperature source Oral, resp. rate 18, height 5\' 9"  (1.753 m), weight 79.7 kg (175 lb 12.8 oz), SpO2 97 %. No results found. No results for input(s): WBC, HGB, HCT, PLT in the last 72 hours. No results for input(s): NA, K, CL, GLUCOSE, BUN, CREATININE, CALCIUM in the last 72 hours.  Invalid input(s): CO CBG (last 3)  No results for input(s): GLUCAP in the last 72 hours.  Wt Readings from Last 3 Encounters:  07/21/16 79.7 kg (175 lb 12.8 oz)  07/13/16 78.7 kg (173 lb 6.4 oz)  06/15/16 79.8 kg (176 lb)    Physical Exam:  Constitutional: He is oriented to person, place, and time. He appears well-developed and well-nourished.  HENT:  Head: [place]  Mouth/Throat:  Oral mucosa pink and moist.  Eyes: EOMI Neck:  Incision clean   Cardiovascular:RRR   Respiratory: cta b.  GI: BS+  Musculoskeletal: slight pain with palp of right tricep area.  Neurological: He is alert and oriented to person, place, and time.  Impaired LT in feet Motor:  RUE: 4/5 prox to dist RLE: 4/5 prox to dist LUE: 4-/5 prox to distal LLE: 3-/5 prox to distal Skin: Skin is warm and dry. Marland Kitchen.  Psychiatric: He has a normal mood and affect. His behavior is normal.   Assessment/Plan: 1. Functional and mobility deficits secondary to cervical myelopathy which require 3+ hours per day of interdisciplinary therapy in a comprehensive inpatient rehab setting. Physiatrist is providing close team supervision and 24 hour management of active medical problems listed  below. Physiatrist and rehab team continue to assess barriers to discharge/monitor patient progress toward functional and medical goals.  Function:  Bathing Bathing position   Position: Shower  Bathing parts Body parts bathed by patient: Right arm, Left arm, Chest, Abdomen, Front perineal area, Buttocks, Right upper leg, Left upper leg, Right lower leg, Left lower leg, Back    Bathing assist Assist Level: Supervision or verbal cues      Upper Body Dressing/Undressing Upper body dressing   What is the patient wearing?: Pull over shirt/dress, Orthosis     Pull over shirt/dress - Perfomed by patient: Thread/unthread right sleeve, Thread/unthread left sleeve, Put head through opening, Pull shirt over trunk       Orthosis activity level: Performed by helper  Upper body assist Assist Level: Set up      Lower Body Dressing/Undressing Lower body dressing   What is the patient wearing?: Pants, Underwear Underwear - Performed by patient: Pull underwear up/down   Pants- Performed by patient: Pull pants up/down, Fasten/unfasten pants   Non-skid slipper socks- Performed by patient: Don/doff right sock, Don/doff left sock       Shoes - Performed by patient: Don/doff right shoe, Don/doff left shoe, Fasten right, Fasten left            Lower body assist Assist for lower body dressing: Supervision or verbal cues      Toileting Toileting   Toileting steps completed by patient: Adjust clothing prior to toileting, Performs perineal hygiene, Adjust clothing after toileting   Toileting Assistive Devices: Grab  bar or rail  Toileting assist Assist level: Supervision or verbal cues   Transfers Chair/bed transfer   Chair/bed transfer method: Ambulatory Chair/bed transfer assist level: Supervision or verbal cues Chair/bed transfer assistive device: Walker, Designer, fashion/clothingArmrests     Locomotion Ambulation     Max distance: 135 Assist level: Supervision or verbal cues   Wheelchair   Type:  Manual Max wheelchair distance: 150 Assist Level: Supervision or verbal cues  Cognition Comprehension Comprehension assist level: Follows basic conversation/direction with extra time/assistive device  Expression Expression assist level: Expresses basic needs/ideas: With extra time/assistive device  Social Interaction Social Interaction assist level: Interacts appropriately 90% of the time - Needs monitoring or encouragement for participation or interaction.  Problem Solving Problem solving assist level: Solves basic 90% of the time/requires cueing < 10% of the time  Memory Memory assist level: Recognizes or recalls 90% of the time/requires cueing < 10% of the time   Medical Problem List and Plan: 1.  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  -continue PT/OT ---pt motivated 2.  DVT Prophylaxis/Anticoagulation: scd/ambulation    3. Pain Management: continue on percocet prn for pain control.   - ?mild strain right triceps 4. Mood: LCSW to follow for evaluation and support.  5. Neuropsych: This patient is capable of making decisions on  his own behalf. 6. Skin/Wound Care: intact. Encourage adequate nutrition 7. Fluids/Electrolytes/Nutrition:   -pushing fluids 8. HTN: Monitor BP bid--continue HCTZ and Norvasc daily.  -fair control at present 9. Constipation:   Senna S at bedtime daily.   10. CKD?: Avoiding nephrotoxic medications.   - re-check BMET on Monday   LOS (Days) 4 A FACE TO FACE EVALUATION WAS PERFORMED  Brian Kelley T 07/25/2016 8:13 AM

## 2016-07-26 ENCOUNTER — Inpatient Hospital Stay (HOSPITAL_COMMUNITY): Payer: Medicaid Other | Admitting: Physical Therapy

## 2016-07-26 ENCOUNTER — Inpatient Hospital Stay (HOSPITAL_COMMUNITY): Payer: Medicaid Other

## 2016-07-26 LAB — BASIC METABOLIC PANEL
Anion gap: 9 (ref 5–15)
BUN: 21 mg/dL — AB (ref 6–20)
CHLORIDE: 98 mmol/L — AB (ref 101–111)
CO2: 26 mmol/L (ref 22–32)
CREATININE: 1.22 mg/dL (ref 0.61–1.24)
Calcium: 9.7 mg/dL (ref 8.9–10.3)
GFR calc Af Amer: >60 mL/min (ref >60)
GFR calc non Af Amer: >60 mL/min (ref >60)
Glucose, Bld: 89 mg/dL (ref 65–99)
Potassium: 4.1 mmol/L (ref 3.5–5.1)
Sodium: 133 mmol/L — ABNORMAL LOW (ref 135–145)

## 2016-07-26 MED ORDER — ENSURE ENLIVE PO LIQD
237.0000 mL | Freq: Two times a day (BID) | ORAL | Status: DC
  Administered 2016-07-26 – 2016-07-31 (×7): 237 mL via ORAL

## 2016-07-26 NOTE — Progress Notes (Signed)
[place] PHYSICAL MEDICINE & REHABILITATION     PROGRESS NOTE    Subjective/Complaints: No new complaints. Slept well.   ROS: Pt denies fever, rash/itching, headache, blurred or double vision, nausea, vomiting, abdominal pain, diarrhea, chest pain, shortness of breath, palpitations, dysuria, dizziness,   bleeding, anxiety, or depression       Objective: Vital Signs: Blood pressure 107/73, pulse 72, temperature 98 F (36.7 C), temperature source Oral, resp. rate 16, height 5\' 9"  (1.753 m), weight 79.7 kg (175 lb 12.8 oz), SpO2 100 %. No results found. No results for input(s): WBC, HGB, HCT, PLT in the last 72 hours.  Recent Labs  07/26/16 0522  NA 133*  K 4.1  CL 98*  GLUCOSE 89  BUN 21*  CREATININE 1.22  CALCIUM 9.7   CBG (last 3)  No results for input(s): GLUCAP in the last 72 hours.  Wt Readings from Last 3 Encounters:  07/21/16 79.7 kg (175 lb 12.8 oz)  07/13/16 78.7 kg (173 lb 6.4 oz)  06/15/16 79.8 kg (176 lb)    Physical Exam:  Constitutional: He is oriented to person, place, and time. He appears well-developed and well-nourished.  HENT:  Head: [place]  Mouth/Throat:  Oral mucosa pink and moist.  Eyes: EOMI Neck:  Incision clean   Cardiovascular:RRR   Respiratory: cta b.  GI: BS+  Musculoskeletal: slight pain with palp of right tricep area.  Neurological: He is alert and oriented to person, place, and time.  Impaired LT in feet Motor:  RUE: 4/5 prox to dist RLE: 4/5 prox to dist LUE: 4-/5 prox to distal LLE: 3-/5 prox to distal Skin: Skin is warm and dry. Marland Kitchen.  Psychiatric: He has a normal mood and affect. His behavior is normal.   Assessment/Plan: 1. Functional and mobility deficits secondary to cervical myelopathy which require 3+ hours per day of interdisciplinary therapy in a comprehensive inpatient rehab setting. Physiatrist is providing close team supervision and 24 hour management of active medical problems listed below. Physiatrist and rehab  team continue to assess barriers to discharge/monitor patient progress toward functional and medical goals.  Function:  Bathing Bathing position   Position: Shower  Bathing parts Body parts bathed by patient: Right arm, Left arm, Chest, Abdomen, Front perineal area, Buttocks, Right upper leg, Left upper leg, Right lower leg, Left lower leg, Back    Bathing assist Assist Level: Supervision or verbal cues      Upper Body Dressing/Undressing Upper body dressing   What is the patient wearing?: Pull over shirt/dress, Orthosis     Pull over shirt/dress - Perfomed by patient: Thread/unthread right sleeve, Thread/unthread left sleeve, Put head through opening, Pull shirt over trunk       Orthosis activity level: Performed by helper  Upper body assist Assist Level: Set up      Lower Body Dressing/Undressing Lower body dressing   What is the patient wearing?: Pants, Underwear Underwear - Performed by patient: Pull underwear up/down   Pants- Performed by patient: Pull pants up/down, Fasten/unfasten pants   Non-skid slipper socks- Performed by patient: Don/doff right sock, Don/doff left sock       Shoes - Performed by patient: Don/doff right shoe, Don/doff left shoe, Fasten right, Fasten left            Lower body assist Assist for lower body dressing: Supervision or verbal cues      Toileting Toileting   Toileting steps completed by patient: Adjust clothing prior to toileting, Adjust clothing after toileting,  Performs perineal hygiene   Toileting Assistive Devices: Grab bar or rail  Toileting assist Assist level: Supervision or verbal cues   Transfers Chair/bed transfer   Chair/bed transfer method: Ambulatory Chair/bed transfer assist level: Supervision or verbal cues Chair/bed transfer assistive device: Environmental consultantWalker, Designer, fashion/clothingArmrests     Locomotion Ambulation     Max distance: 135 Assist level: Supervision or verbal cues   Wheelchair   Type: Manual Max wheelchair distance:  150 Assist Level: Supervision or verbal cues  Cognition Comprehension Comprehension assist level: Follows basic conversation/direction with extra time/assistive device  Expression Expression assist level: Expresses basic needs/ideas: With extra time/assistive device  Social Interaction Social Interaction assist level: Interacts appropriately 90% of the time - Needs monitoring or encouragement for participation or interaction.  Problem Solving Problem solving assist level: Solves basic 90% of the time/requires cueing < 10% of the time  Memory Memory assist level: Recognizes or recalls 90% of the time/requires cueing < 10% of the time   Medical Problem List and Plan: 1.  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  -continue therapies 2.  DVT Prophylaxis/Anticoagulation: scd/ambulation    3. Pain Management: continue on percocet prn for pain control.   - overall improved 4. Mood: LCSW to follow for evaluation and support.  5. Neuropsych: This patient is capable of making decisions on  his own behalf. 6. Skin/Wound Care: intact. Encourage adequate nutrition 7. Fluids/Electrolytes/Nutrition:   -pushing fluids 8. HTN: Monitor BP bid--continue HCTZ and Norvasc daily.  -fair control   9. Constipation:   Senna S at bedtime daily.   10. CKD?: Avoiding nephrotoxic medications.   - I personally reviewed the patient's labs today.   -BUn/Cr improved today. Labs stable   LOS (Days) 5 A FACE TO FACE EVALUATION WAS PERFORMED  SWARTZ,ZACHARY T 07/26/2016 8:49 AM

## 2016-07-26 NOTE — Progress Notes (Signed)
Physical Therapy Session Note  Patient Details  Name: Brian Kelley MRN: [id number] Date of Birth: 03-Jan-1956  Today's Date: 07/26/2016 PT Individual Time: 1400-1449 PT Individual Time Calculation (min): 49 min    Skilled Therapeutic Interventions/Progress Updates:    Pt received in bed noting 7-8/10 Back pain & RN made aware; meds administered during session. Initial portion of session focused on ortho consult with Thayer Ohmhris. Pt provided with L PLS AFO and able to ambulate with RW & steady assist without LLE recurvatum. Pt continues to demonstrate significant LLE external rotation and toe drag; Chris to apply shoe cap to pt's shoe. Educated pt on importance of wearing a sock with AFO to prevent skin breakdown. Educated pt on standing LLE hip extension with orange theraband for hip strengthening; pt unable to demonstrate proper technique even with max multimodal cuing & demonstration. Utilized nu-step on level 3 x 10 minutes with BLE only focusing on coordination of reciprocal movements & BLE strengthening. Pt ambulated with RW & steady assist back to room & left in bathroom with NT present. Educated pt on need to have nursing staff with him when wanting to transfer OOB/use bathroom; pt will need reinforcement of this as he stated "I can make it that far".   Therapy Documentation Precautions:  Precautions Precautions: Fall, Cervical Required Braces or Orthoses: Cervical Brace Cervical Brace: Hard collar (collar can be removed in bed and for showers) Restrictions Weight Bearing Restrictions: No  See Function Navigator for Current Functional Status.   Therapy/Group: Individual Therapy  Sandi MariscalVictoria M Tamberlyn Midgley 07/26/2016, 3:24 PM

## 2016-07-26 NOTE — Progress Notes (Signed)
Occupational Therapy Session Note  Patient Details  Name: Darcel BayleyMorris D Wassink MRN: [id number] Date of Birth: [birth year]  Today's Date: 07/26/2016 OT Individual Time: 1000-1100 OT Individual Time Calculation (min): 60 min     Short Term Goals: Week 1:  OT Short Term Goal 1 (Week 1): STGs = LTGs due to LOS <10 days  Skilled Therapeutic Interventions/Progress Updates:    Pt engaged in BADL retraining including bathing at shower level and dressing with sit<>stand from chair.  Pt completed grooming tasks seated at sink.  Pt amb with with RW (supervision) to bathroom for shower and returned to sink for grooming tasks.  Pt required verbal cues for safety when doffing pants.  Recommended that pt sit in chair to doff/donn pants.  Pt verbalized understanding.  Pt returned to bed and sat EOB with bed alarm activated.  Focus on activity tolerance, functional amb with RW, sit<>stand, standing balance, and safety awareness to increase independence with BADLs.  Therapy Documentation Precautions:  Precautions Precautions: Fall, Cervical Required Braces or Orthoses: Cervical Brace Cervical Brace: Hard collar (collar can be removed in bed and for showers) Restrictions Weight Bearing Restrictions: No General:   Vital Signs: Therapy Vitals BP: (!) 118/5 Pain:   ADL: ADL ADL Comments: Supervision overall Exercises:   Other Treatments:    See Function Navigator for Current Functional Status.   Therapy/Group: Individual Therapy  Rich BraveLanier, Jalon Blackwelder Chappell 07/26/2016, 11:05 AM

## 2016-07-26 NOTE — Progress Notes (Signed)
Physical Therapy Session Note  Patient Details  Name: Brian Kelley MRN: [id number] Date of Birth: 01/07/1956  Today's Date: 07/26/2016 PT Individual Time: 0800-0900 and 1505-1530 PT Individual Time Calculation (min): 60 min and 25 min (total 85 min)   Short Term Goals: Week 1:  PT Short Term Goal 1 (Week 1): Pt will complete transfers with supervision PT Short Term Goal 2 (Week 1): Pt will be able to gait x 150' with supervision with LRAD PT Short Term Goal 3 (Week 1): Pt will demonstrate functional increase in strength in LLE (foot clearance) or determine if there is a need for bracing.   Skilled Therapeutic Interventions/Progress Updates:   Tx 1: Pt received supine in bed, denies pain and reports hot packs were very helpful. Notified RN of request for k-pad. Supine>sit with modI. Neck brace donned totalA. Pt donned shoes on EOB with setupA. Stand pivot transfer bed >w/c with close S. W/c propulsion to gym BUE x160' with S for BUE strengthening and endurance. Donned LLE PLS AFO with 1/4" heel wedge to improve gait mechanics; gait x50' with RW and min guard. Tactile cues for L glute activation/hip extension, and at L knee to reduce recurvatum; verbal cues for upright trunk. Pt unable to tolerate AFO >1 trial due to tight fit of shoes and irritation at insole. AFO removed, heel wedge left for assist with knee control; plan to order AFO consult. Stand pivot transfer w/c >mat table with S stand pivot. While supine, heating pad applied to upper back/shoulders for pain relief. Bridging 2x20 with ball squeeze adduction. Straight leg raise 2x10 BLE with cues for internal rotation at L hip. Heel slides 2x20 BLE, AAROM on LLE to maintain neutral alignment. Sit <>stand x10 reps with min/modA without UE assist; cues for anterior weight shift. Propelled w/c back to room with S. Ambulated in/out of bathroom with RW and S; significant L foot drag without AFO. Remained in bed at end of session, alarm intact and  all needs in reach with hot pack in place on shoulders. Instructed pt in removal of heat in 20-30 min; pt agreeable.   Tx 2: Pt received seated on EOB, denies pain and agreeable to treatment. Sit >stand with S. Standing heel/toe raises 2x20 each with BUE support. Standing marching with BUE support; mod tactile cues for L hip extension in stance. Progressive balance exercises including normal BOS eyes closed, narrow BOS and tandem stance without UE support. One major LOB to R side when standing narrow BOS with eyes closed; required modA to recover. Sit <>stand x10 reps with light UE support. Remained in bed with hot pack in place on upper back/shoulders, all needs in reach and alarm intact at end of session.   Therapy Documentation Precautions:  Precautions Precautions: Fall, Cervical Required Braces or Orthoses: Cervical Brace Cervical Brace: Hard collar (collar can be removed in bed and for showers) Restrictions Weight Bearing Restrictions: No   See Function Navigator for Current Functional Status.   Therapy/Group: Individual Therapy  Vista Lawmanlizabeth J Tygielski 07/26/2016, 8:51 AM

## 2016-07-26 NOTE — Progress Notes (Signed)
Orthopedic Tech Progress Note Patient Details:  Brian Kelley 05/16/1956 [id number]  Patient ID: Brian Kelley, male   DOB: 05/16/1956, 60 y.o.   MRN: [id number]   Nikki DomCrawford, Victorian Gunn 07/26/2016, 9:29 AM Called in advanced brace order; spoke with Juanda ChanceKanisha

## 2016-07-26 NOTE — Progress Notes (Signed)
Orthopedic Tech Progress Note Patient Details:  Darcel BayleyMorris D Able 07-Apr-1956 [id number] Brace completed by [facility] Patient ID: Darcel BayleyMorris D Ironside, male   DOB: 07-Apr-1956, 60 y.o.   MRN: [id number]   Jennye MoccasinHughes, Apollo Timothy Craig 07/26/2016, 9:58 PM

## 2016-07-27 ENCOUNTER — Inpatient Hospital Stay (HOSPITAL_COMMUNITY): Payer: Medicaid Other | Admitting: Physical Therapy

## 2016-07-27 ENCOUNTER — Inpatient Hospital Stay (HOSPITAL_COMMUNITY): Payer: Medicaid Other

## 2016-07-27 MED ORDER — POLYETHYLENE GLYCOL 3350 17 G PO PACK
17.0000 g | PACK | Freq: Three times a day (TID) | ORAL | Status: DC
  Administered 2016-07-27 – 2016-07-31 (×8): 17 g via ORAL
  Filled 2016-07-27 (×11): qty 1

## 2016-07-27 MED ORDER — SORBITOL 70 % SOLN
45.0000 mL | Freq: Once | Status: AC
  Administered 2016-07-27: 45 mL via ORAL
  Filled 2016-07-27: qty 60

## 2016-07-27 NOTE — Progress Notes (Signed)
Physical Therapy Session Note  Patient Details  Name: Brian Kelley MRN: [id number] Date of Birth: 06-16-1956  Today's Date: 07/27/2016 PT Individual Time: 0900-0930 PT Individual Time Calculation (min): 30 min    Short Term Goals: Week 1:  PT Short Term Goal 1 (Week 1): Pt will complete transfers with supervision PT Short Term Goal 2 (Week 1): Pt will be able to gait x 150' with supervision with LRAD PT Short Term Goal 3 (Week 1): Pt will demonstrate functional increase in strength in LLE (foot clearance) or determine if there is a need for bracing.   Skilled Therapeutic Interventions/Progress Updates:     Therapy Documentation Precautions:  Precautions Precautions: Fall, Cervical Required Braces or Orthoses: Cervical Brace Cervical Brace: Hard collar (collar can be removed in bed and for showers) Restrictions Weight Bearing Restrictions: No   Patient received directly from PT Lizzy.   Patient denies any pain.  Patient ambulated 175 feetx2 with left AFO and RW to and from room and therapy gym. Verbal cues for RW management, environmental awareness. Patient ambulated with a step though gait pattern. Verbal cues required throughout to maintain Spinal precautions throughout ambulation.  Short sit to and from supine on mat and bed mod I.  BLE gastroc stretch 5x 30 seconds each  Patient performed mini squats 3x10 on ramp with BUE support rail in order to increase stretch of gastroc musculature as well for dynamic balance and strengthening.     Patient returned to room at end of session with all needs met. Bed alarm engaged and all needs met.    See Function Navigator for Current Functional Status.   Therapy/Group: Individual Therapy  Retta Diones 07/27/2016, 9:27 AM

## 2016-07-27 NOTE — Progress Notes (Signed)
Occupational Therapy Session Note  Patient Details  Name: Brian BayleyMorris D Ragsdale MRN: [id number] Date of Birth: 14-Jul-1956  Today's Date: 07/27/2016 OT Individual Time: 1000-1100 OT Individual Time Calculation (min): 60 min     Short Term Goals: Week 1:  OT Short Term Goal 1 (Week 1): STGs = LTGs due to LOS <10 days  Skilled Therapeutic Interventions/Progress Updates:    Pt declined bathing/dressing tasks this morning and stated would shower tomorrow.  Pt amb with RW to ADL apartment and practiced bed transfers and walk-in shower transfers at supervision level.  Pt amb with RW to therapy gym and engaged in dynamic standing activities and functional amb activities before returning to room and sat EOB with bed alarm activated.  Pt completed all tasks at supervision level.  Focus on safety awareness, activity tolerance, functional amb with RW, standing balance, and discharge planning to increase independence with BADLs.  Therapy Documentation Precautions:  Precautions Precautions: Fall, Cervical Required Braces or Orthoses: Cervical Brace Cervical Brace: Hard collar (collar can be removed in bed and for showers) Restrictions Weight Bearing Restrictions: No General:   Vital Signs:   Pain:   ADL: ADL ADL Comments: Supervision overall Exercises:   Other Treatments:    See Function Navigator for Current Functional Status.   Therapy/Group: Individual Therapy  Rich BraveLanier, Deslyn Cavenaugh Chappell 07/27/2016, 11:33 AM

## 2016-07-27 NOTE — Progress Notes (Signed)
[facility]IC[facility] RE[facility]ILIT[facility]     PROGRESS NOTE    Subjective/Complaints: Feeling well. Donning AFO when I walked in. Right arm feeling better.   ROS: Pt denies fever, rash/itching, headache, blurred or double vision, nausea, vomiting, abdominal pain, diarrhea, chest pain, shortness of breath, palpitations, dysuria, dizziness, neck pain, back pain, bleeding, anxiety, or depression       Objective: Vital Signs: Blood pressure 108/72, pulse 78, temperature 98.2 F (36.8 C), temperature source Oral, resp. rate 18, height 5\' 9"  (1.753 m), weight 79.7 kg (175 lb 12.8 oz), SpO2 98 %. No results found. No results for input(s): WBC, HGB, HCT, PLT in the last 72 hours.  Recent Labs  07/26/16 0522  NA 133*  K 4.1  CL 98*  GLUCOSE 89  BUN 21*  CREATININE 1.22  CALCIUM 9.7   CBG (last 3)  No results for input(s): GLUCAP in the last 72 hours.  Wt Readings from Last 3 Encounters:  07/21/16 79.7 kg (175 lb 12.8 oz)  07/13/16 78.7 kg (173 lb 6.4 oz)  06/15/16 79.8 kg (176 lb)    Physical Exam:  Constitutional: no distress Oriented to person, place, and time. He appears well-developed and well-nourished.  HENT:  Head: [place]  Mouth/Throat:  Oral mucosa pink and moist.  Eyes: PERRL Neck:  Incision clean and dry  Cardiovascular:RRR   Respiratory: CTA B GI: BS+  Musculoskeletal: slight pain with palp of right tricep area.  Neurological: He is alert and oriented to person, place, and time.  Impaired LT in feet Motor:  RUE: 4/5 prox to dist RLE: 4/5 prox to dist LUE: 4-/5 prox to distal LLE: 3 to 4/5 prox to distal, difficulties with proprioception Skin: Skin is warm and dry. Marland Kitchen.  Psychiatric: He has a normal mood and affect. His behavior is normal.   Assessment/Plan: 1. Functional and mobility deficits secondary to cervical myelopathy which require 3+ hours per day of interdisciplinary therapy in a comprehensive inpatient rehab setting. Physiatrist is  providing close team supervision and 24 hour management of active medical problems listed below. Physiatrist and rehab team continue to assess barriers to discharge/monitor patient progress toward functional and medical goals.  Function:  Bathing Bathing position   Position: Shower  Bathing parts Body parts bathed by patient: Right arm, Left arm, Chest, Abdomen, Front perineal area, Buttocks, Right upper leg, Left upper leg, Right lower leg, Left lower leg, Back    Bathing assist Assist Level: Supervision or verbal cues      Upper Body Dressing/Undressing Upper body dressing   What is the patient wearing?: Pull over shirt/dress, Orthosis     Pull over shirt/dress - Perfomed by patient: Thread/unthread right sleeve, Thread/unthread left sleeve, Put head through opening, Pull shirt over trunk       Orthosis activity level: Performed by helper  Upper body assist Assist Level: Set up      Lower Body Dressing/Undressing Lower body dressing   What is the patient wearing?: Pants, Underwear Underwear - Performed by patient: Pull underwear up/down   Pants- Performed by patient: Pull pants up/down, Fasten/unfasten pants   Non-skid slipper socks- Performed by patient: Don/doff right sock, Don/doff left sock       Shoes - Performed by patient: Don/doff right shoe, Don/doff left shoe, Fasten right, Fasten left            Lower body assist Assist for lower body dressing: Supervision or verbal cues      Interior and spatial designerToileting Toileting  Toileting steps completed by patient: Adjust clothing prior to toileting, Adjust clothing after toileting, Performs perineal hygiene   Toileting Assistive Devices: Grab bar or rail  Toileting assist Assist level: Supervision or verbal cues   Transfers Chair/bed transfer   Chair/bed transfer method: Stand pivot Chair/bed transfer assist level: Supervision or verbal cues Chair/bed transfer assistive device: Armrests     Locomotion Ambulation     Max  distance: 150 ft Assist level: Touching or steadying assistance (Pt > 75%)   Wheelchair   Type: Manual Max wheelchair distance: 160 Assist Level: Supervision or verbal cues  Cognition Comprehension Comprehension assist level: Follows basic conversation/direction with extra time/assistive device  Expression Expression assist level: Expresses basic needs/ideas: With extra time/assistive device  Social Interaction Social Interaction assist level: Interacts appropriately 90% of the time - Needs monitoring or encouragement for participation or interaction.  Problem Solving Problem solving assist level: Solves basic 90% of the time/requires cueing < 10% of the time  Memory Memory assist level: Recognizes or recalls 90% of the time/requires cueing < 10% of the time   Medical Problem List and Plan: 1.  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  -continue therapies  -AFO LLE per [facility]  -team conference today---mod I goals except shower? 2.  DVT Prophylaxis/Anticoagulation: scd/ambulation    3. Pain Management with percocet 4. Mood: LCSW to follow for evaluation and support.  5. Neuropsych: This patient is capable of making decisions on  his own behalf. 6. Skin/Wound Care: intact. Encourage adequate nutrition 7. Fluids/Electrolytes/Nutrition:   -pushing fluids 8. HTN: Monitor BP bid--continue HCTZ and Norvasc daily.  -good control   9. Constipation:   Senna S at bedtime daily.   10. CKD?: Avoiding nephrotoxic medications.   - I personally reviewed the patient's labs today.   -BUn/Cr improved today. Labs stable   LOS (Days) 6 A FACE TO FACE EVALUATION WAS PERFORMED  Madylyn Insco T 07/27/2016 8:39 AM

## 2016-07-27 NOTE — Progress Notes (Signed)
Physical Therapy Session Note  Patient Details  Name: Brian BayleyMorris D Gills MRN: [id number] Date of Birth: 03-Mar-1956  Today's Date: 07/27/2016 PT Individual Time: 0800-0900 and 1300-1400 PT Individual Time Calculation (min): 60 min and 60 min (total 120 min)    Short Term Goals: Week 1:  PT Short Term Goal 1 (Week 1): Pt will complete transfers with supervision PT Short Term Goal 2 (Week 1): Pt will be able to gait x 150' with supervision with LRAD PT Short Term Goal 3 (Week 1): Pt will demonstrate functional increase in strength in LLE (foot clearance) or determine if there is a need for bracing.   Skilled Therapeutic Interventions/Progress Updates:  Tx 1: Pt received supine in bed, denies pain and agreeable to treatment.  Supine>sit modI. Dons neck brace, socks and shoes modI. Gait to gym with RW and LLE orthosis with S; improved foot clearance and reduced knee recurvatum. RW raised one level to encourage upright posture and hip extension. Stairs 1x8 on 6" stairs with 1 handrail to simulate sister's home entry. Lateral step ups 2x15 BLE with assist to maintain LLE in neutral alignment. Sit <>stand 2x5 reps with assist to maintain L foot in neutral. Floor transfer performed modI. Standing alternating LE taps to 1" and 3" step with min guard. Gait to returned to room with RW and S. Ambulated in/out of bathroom with S; performed clothing management and hygiene modI. Remained seated on EOB with handoff to therapist for next session.   Tx 2: Pt received asleep in bed; denies pain and agreeable to treatment. Bed mobility and donning shoes, neck brace with AFO. Gait to/from gym with RW and LLE AFO with S; min tactile cues for upright posture. Standing balance on airex pad while engaged in BUE card game; minor LOBs initially however does not require assist to regain balance. Nustep x12 min with BLE for coordination, strengthening. Gait to return to room as described above; ambulated in/out of bathroom with RW  and distant S. Performed clothing management and hygiene modI. Remained seated on EOB at end of session, all needs in reach.   Therapy Documentation Precautions:  Precautions Precautions: Fall, Cervical Required Braces or Orthoses: Cervical Brace Cervical Brace: Hard collar (collar can be removed in bed and for showers) Restrictions Weight Bearing Restrictions: No   See Function Navigator for Current Functional Status.   Therapy/Group: Individual Therapy  Vista Lawmanlizabeth J Tygielski 07/27/2016, 8:50 AM

## 2016-07-28 ENCOUNTER — Inpatient Hospital Stay (HOSPITAL_COMMUNITY): Payer: Medicaid Other

## 2016-07-28 ENCOUNTER — Inpatient Hospital Stay (HOSPITAL_COMMUNITY): Payer: Medicaid Other | Admitting: Physical Therapy

## 2016-07-28 NOTE — Progress Notes (Signed)
Physical Therapy Session Note  Patient Details  Name: Brian BayleyMorris D Paez MRN: [id number] Date of Birth: 27-Jun-1956  Today's Date: 07/28/2016 PT Individual Time: 0730-0830 PT Individual Time Calculation (min): 60 min    Short Term Goals: Week 1:  PT Short Term Goal 1 (Week 1): Pt will complete transfers with supervision PT Short Term Goal 2 (Week 1): Pt will be able to gait x 150' with supervision with LRAD PT Short Term Goal 3 (Week 1): Pt will demonstrate functional increase in strength in LLE (foot clearance) or determine if there is a need for bracing.   Skilled Therapeutic Interventions/Progress Updates:   Pt received supine in bed with cervical collar intact; denies pain and agreeable to treatment. Dons pants, socks, shoes and LLE AFO modI from EOB with increased time. Ambulates in/out of bathroom with RW and S; performs clothing management and hygiene modI. Discussed upcoming d/c plan with pt and no PT follow up, however plan to provide pt with HEP and review prior to d/c. Educated pt in changing/cleaning pads in cervical collar; pt able to change pads with min verbal cues. Seated at sink pt performed teeth brushing modI, and washing neck pads with min instructional cues. Gait to gym with RW and S; mod verbal/tactile cues for upright posture, hip/trunk extension. Sitting/standing kinetron for LE strengthening, coordination and postural retraining. Repetitive cues for hip extension when standing. Gait to return to room with RW and S. Remained seated on EOB at end of session, all needs in reach.   Therapy Documentation Precautions:  Precautions Precautions: Fall, Cervical Required Braces or Orthoses: Cervical Brace Cervical Brace: Hard collar (collar can be removed in bed and for showers) Restrictions Weight Bearing Restrictions: No   See Function Navigator for Current Functional Status.   Therapy/Group: Individual Therapy  Vista Lawmanlizabeth J Tygielski 07/28/2016, 8:13 AM

## 2016-07-28 NOTE — Progress Notes (Signed)
Physical Therapy Note  Patient Details  Name: Brian Kelley MRN: [id number] Date of Birth: [birth year] Today's Date: 07/28/2016  1300-1345, 45 min individual tx Pain:tingling RUE, unrated  Pt needed max assist to don neck brace due to inability to follow cervical precautions, trying to turn head, put on uprside down, etc even with use of mirror. Pt needed mod assist to don L AFO.  W/c propulsion using bil UEs with supervision, cues for efficicency, for activity tolerance.  Nfeuromuscular re-education via forced use, for alternating reciprocal movement x bil LEs in sitting on Kinetron at level 50 , 2 cycles x 30.  Prolonged stretching bil hamstrings and heel cords in standing on wedge with bil UE support on RW.  Standing with bil UEs supportR/L hip abduction with manual cues and R/L hamstring curls x 10 each.  Gait with RW x 90' with RW, min guard assist. Pt left sitting in w/c, awaiting next therapist, with quick release belt donned, and instructed to perform LLE knee flex/ext iwht foot on MaxiSlide for reudced friction on floor.    See function navigator for current sattus  Evertte Sones 07/28/2016, 12:46 PM

## 2016-07-28 NOTE — Progress Notes (Signed)
Social Work Patient ID: DORSE LOCY, male   DOB: 13-Jun-1956, 60 y.o.   MRN: [id number]   CSW met with pt to update him on team conference discussion.  Pt is progressing well in therapies and he agrees he will be ready to go on 07-31-16.  Pt is still very focused on getting his social security disability and CSW again told him that CSW cannot do anything with this process except to send [employer] new medical information.  CSW called [employer] and they will not give CSW any information since CSW is not listed in application.  CSW offered for pt to add CSW to case and he has phone number to do so.  CSW will fax d/c summary and letter from MD to Hearing Office with [place] on 08-02-16.  Pt does not qualify for HH therapies through his medicaid and does not need home health nursing.  CSW did make community case management referral to [facility] of [place] and they will contact him on 08-02-16.  CSW will f/u on pt's rolling walker.  CSW will continue to follow and assist as needed.

## 2016-07-28 NOTE — Progress Notes (Signed)
Occupational Therapy Session Note  Patient Details  Name: Brian Kelley MRN: [id number] Date of Birth: [birth year]  Today's Date: 07/28/2016 OT Individual Time: 1100-1200 OT Individual Time Calculation (min): 60 min     Short Term Goals: Week 1:  OT Short Term Goal 1 (Week 1): STGs = LTGs due to LOS <10 days  Skilled Therapeutic Interventions/Progress Updates:    Pt engaged in BADL retraining including bathing at shower level, dressing with sit<>stand from chair, grooming tasks at sink, and toileting.  Pt completed all functional transfers and BADLs at supervision level with min verbal cues for safety awareness when doffing/donning pants.  Pt issued RW bag and educated on RW safety.   Therapy Documentation Precautions:  Precautions Precautions: Fall, Cervical Required Braces or Orthoses: Cervical Brace Cervical Brace: Hard collar (collar can be removed in bed and for showers) Restrictions Weight Bearing Restrictions: No   Pain:  Pt c/o BLE numbness and RUE tingling; RN aware  See Function Navigator for Current Functional Status.   Therapy/Group: Individual Therapy  Rich BraveLanier, Artemisa Sladek Chappell 07/28/2016, 12:06 PM

## 2016-07-28 NOTE — Progress Notes (Signed)
Patient LBM 07/23/16, encourage patient to increase fluid intake was given prune juice along bowel regimen this am. Patient he had been drinking a lot sodas, encourage him to drink plenty of water.

## 2016-07-28 NOTE — Progress Notes (Signed)
[place] PHYSICAL MEDICINE & REHABILITATION     PROGRESS NOTE    Subjective/Complaints: Up walking with PT. No complaints  ROS: Pt denies fever, rash/itching, headache, blurred or double vision, nausea, vomiting, abdominal pain, diarrhea, chest pain, shortness of breath, palpitations, dysuria, dizziness,   bleeding, anxiety, or depression        Objective: Vital Signs: Blood pressure 124/74, pulse 78, temperature 98.2 F (36.8 C), temperature source Oral, resp. rate 18, height 5\' 9"  (1.753 m), weight 79.7 kg (175 lb 12.8 oz), SpO2 98 %. No results found. No results for input(s): WBC, HGB, HCT, PLT in the last 72 hours.  Recent Labs  07/26/16 0522  NA 133*  K 4.1  CL 98*  GLUCOSE 89  BUN 21*  CREATININE 1.22  CALCIUM 9.7   CBG (last 3)  No results for input(s): GLUCAP in the last 72 hours.  Wt Readings from Last 3 Encounters:  07/21/16 79.7 kg (175 lb 12.8 oz)  07/13/16 78.7 kg (173 lb 6.4 oz)  06/15/16 79.8 kg (176 lb)    Physical Exam:  Constitutional: NAD  .  HENT:  Head: [place]  Mouth/Throat:  Oral mucosa pink and moist.  Eyes: EOMI Neck:  Incision clean and dry  Cardiovascular: RRR  Respiratory: clear GI: NT/ND  Musculoskeletal: slight pain with palp of right tricep area.  Neurological: He is alert and oriented to person, place, and time.  Impaired LT in feet persists Motor:  RUE: 4/5 prox to dist RLE: 4/5 prox to dist LUE: 4-/5 prox to distal LLE: 3 to 4/5 prox to distal,walks with external rotation of left hip and lateral whip of foot Skin: Skin is warm and dry. Marland Kitchen.  Psychiatric: He has a normal mood and affect. His behavior is normal.   Assessment/Plan: 1. Functional and mobility deficits secondary to cervical myelopathy which require 3+ hours per day of interdisciplinary therapy in a comprehensive inpatient rehab setting. Physiatrist is providing close team supervision and 24 hour management of active medical problems listed below. Physiatrist  and rehab team continue to assess barriers to discharge/monitor patient progress toward functional and medical goals.  Function:  Bathing Bathing position   Position: Shower  Bathing parts Body parts bathed by patient: Right arm, Left arm, Chest, Abdomen, Front perineal area, Buttocks, Right upper leg, Left upper leg, Right lower leg, Left lower leg, Back    Bathing assist Assist Level: Supervision or verbal cues      Upper Body Dressing/Undressing Upper body dressing   What is the patient wearing?: Pull over shirt/dress, Orthosis     Pull over shirt/dress - Perfomed by patient: Thread/unthread right sleeve, Thread/unthread left sleeve, Put head through opening, Pull shirt over trunk       Orthosis activity level: Performed by helper  Upper body assist Assist Level: Set up      Lower Body Dressing/Undressing Lower body dressing   What is the patient wearing?: Shoes, AFO, Pants, Socks Underwear - Performed by patient: Pull underwear up/down   Pants- Performed by patient: Pull pants up/down, Thread/unthread left pants leg, Thread/unthread right pants leg, Fasten/unfasten pants   Non-skid slipper socks- Performed by patient: Don/doff right sock, Don/doff left sock       Shoes - Performed by patient: Don/doff right shoe, Don/doff left shoe, Fasten right, Fasten left   AFO - Performed by patient: Don/doff left AFO        Lower body assist Assist for lower body dressing: More than reasonable time  Toileting Toileting   Toileting steps completed by patient: Adjust clothing prior to toileting, Adjust clothing after toileting, Performs perineal hygiene   Toileting Assistive Devices: Grab bar or rail  Toileting assist Assist level: More than reasonable time   Transfers Chair/bed transfer   Chair/bed transfer method: Ambulatory Chair/bed transfer assist level: Supervision or verbal cues Chair/bed transfer assistive device: Patent attorneyWalker     Locomotion Ambulation      Max distance: 170 Assist level: Supervision or verbal cues   Wheelchair   Type: Manual Max wheelchair distance: 160 Assist Level: Supervision or verbal cues  Cognition Comprehension Comprehension assist level: Follows basic conversation/direction with extra time/assistive device  Expression Expression assist level: Expresses basic needs/ideas: With extra time/assistive device  Social Interaction Social Interaction assist level: Interacts appropriately 90% of the time - Needs monitoring or encouragement for participation or interaction.  Problem Solving Problem solving assist level: Solves basic 90% of the time/requires cueing < 10% of the time  Memory Memory assist level: Recognizes or recalls 90% of the time/requires cueing < 10% of the time   Medical Problem List and Plan: 1.  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  -continue therapies  -AFO LLE per [facility]  -gait pattern apparently chronic,exacerbated by myelopathy 2.  DVT Prophylaxis/Anticoagulation: scd/ambulating well    3. Pain Management with percocet--controlled 4. Mood: LCSW to follow for evaluation and support.  5. Neuropsych: This patient is capable of making decisions on  his own behalf. 6. Skin/Wound Care: intact. Encourage adequate nutrition 7. Fluids/Electrolytes/Nutrition:   -pushing fluids 8. HTN: Monitor BP bid--continue HCTZ and Norvasc daily.  -good control at present   9. Constipation:   Senna S at bedtime daily.   10. CKD?: Avoiding nephrotoxic medications.   - I personally reviewed the patient's labs today.   -BUn/Cr improved    LOS (Days) 7 A FACE TO FACE EVALUATION WAS PERFORMED  Shayden Gingrich T 07/28/2016 8:25 AM
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
## 2016-07-28 NOTE — Progress Notes (Signed)
Occupational Therapy Note  Patient Details  Name: Brian Kelley MRN: [id number] Date of Birth: October 28, 1955  Today's Date: 07/28/2016 OT Individual Time: 1400-1430 OT Individual Time Calculation (min): 30 min    Pt denied pain Individual Therapy  Pt engaged in simple home mgmt tasks [facility] level-retrieving items from dresser drawers and placing in dirty linen bag, making up bed in ADL apartment, changing linens on bed in room, toilet transfers, and toileting.  Pt required min verbal cues for RW safety but completed all tasks at supervision level.  Pt remained seated EOB with bed alarm activated.  [name], [name] [name] 07/28/2016, 2:48 PM

## 2016-07-29 NOTE — Progress Notes (Signed)
PHYSICAL MEDICINE & REHABILITATION     PROGRESS NOTE    Subjective/Complaints:   ROS: Pt denies fever, rash/itching, headache, blurred or double vision, nausea, vomiting, abdominal pain, diarrhea, chest pain, shortness of breath, palpitations, dysuria, dizziness,   bleeding, anxiety, or depression        Objective: Vital Signs: Blood pressure 112/80, pulse 78, temperature 98.2 F (36.8 C), temperature source Oral, resp. rate 18, height 5\' 9"  (1.753 m), weight 79.7 kg (175 lb 12.8 oz), SpO2 98 %. No results found. No results for input(s): WBC, HGB, HCT, PLT in the last 72 hours. No results for input(s): NA, K, CL, GLUCOSE, BUN, CREATININE, CALCIUM in the last 72 hours.  Invalid input(s): CO CBG (last 3)  No results for input(s): GLUCAP in the last 72 hours.  Wt Readings from Last 3 Encounters:  07/21/16 79.7 kg (175 lb 12.8 oz)  07/13/16 78.7 kg (173 lb 6.4 oz)  06/15/16 79.8 kg (176 lb)    Physical Exam:  Constitutional: NAD  .  HENT:  Head: [place]  Mouth/Throat:  Oral mucosa pink and moist.  Eyes: EOMI Neck:  Incision clean and dry  Cardiovascular: RRR  Respiratory: clear GI: NT/ND  Musculoskeletal: slight pain with palp of right tricep area.  Neurological: He is alert and oriented to person, place, and time.  Impaired LT in feet persists Motor:  RUE: 4/5 prox to dist RLE: 4/5 prox to dist LUE: 4-/5 prox to distal LLE: 3 to 4/5 prox to distal,walks with external rotation of left hip and lateral whip of foot Skin: Skin is warm and dry. Marland Kitchen.  Psychiatric: He has a normal mood and affect. His behavior is normal.   Assessment/Plan: 1. Functional and mobility deficits secondary to cervical myelopathy which require 3+ hours per day of interdisciplinary therapy in a comprehensive inpatient rehab setting. Physiatrist is providing close team supervision and 24 hour management of active medical problems listed below. Physiatrist and rehab team continue to  assess barriers to discharge/monitor patient progress toward functional and medical goals.  Function:  Bathing Bathing position   Position: Shower  Bathing parts Body parts bathed by patient: Right arm, Left arm, Chest, Abdomen, Front perineal area, Buttocks, Right upper leg, Left upper leg, Right lower leg, Left lower leg, Back    Bathing assist Assist Level: Supervision or verbal cues      Upper Body Dressing/Undressing Upper body dressing   What is the patient wearing?: Pull over shirt/dress, Orthosis     Pull over shirt/dress - Perfomed by patient: Thread/unthread right sleeve, Thread/unthread left sleeve, Put head through opening, Pull shirt over trunk       Orthosis activity level: Performed by helper  Upper body assist Assist Level: Set up      Lower Body Dressing/Undressing Lower body dressing   What is the patient wearing?: Shoes, AFO, Pants, Socks Underwear - Performed by patient: Thread/unthread right underwear leg, Thread/unthread left underwear leg, Pull underwear up/down   Pants- Performed by patient: Thread/unthread right pants leg, Thread/unthread left pants leg, Pull pants up/down, Fasten/unfasten pants   Non-skid slipper socks- Performed by patient: Don/doff right sock, Don/doff left sock   Socks - Performed by patient: Don/doff right sock, Don/doff left sock   Shoes - Performed by patient: Don/doff right shoe, Don/doff left shoe, Fasten right, Fasten left   AFO - Performed by patient: Don/doff left AFO        Lower body assist Assist for lower body dressing: More than reasonable time  Toileting Toileting   Toileting steps completed by patient: Adjust clothing prior to toileting, Adjust clothing after toileting, Performs perineal hygiene   Toileting Assistive Devices: Grab bar or rail  Toileting assist Assist level: More than reasonable time   Transfers Chair/bed transfer   Chair/bed transfer method: Ambulatory Chair/bed transfer assist  level: Supervision or verbal cues Chair/bed transfer assistive device: Patent attorneyWalker     Locomotion Ambulation     Max distance: 90 Assist level: Touching or steadying assistance (Pt > 75%)   Wheelchair   Type: Manual Max wheelchair distance: 150 Assist Level: Supervision or verbal cues  Cognition Comprehension Comprehension assist level: Follows basic conversation/direction with extra time/assistive device  Expression Expression assist level: Expresses basic needs/ideas: With extra time/assistive device  Social Interaction Social Interaction assist level: Interacts appropriately 90% of the time - Needs monitoring or encouragement for participation or interaction.  Problem Solving Problem solving assist level: Solves basic 90% of the time/requires cueing < 10% of the time  Memory Memory assist level: Recognizes or recalls less than 25% of the time/requires cueing greater than 75% of the time   Medical Problem List and Plan: 1.  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  -continue therapies  -AFO LLE per [facility], plan d/c on 11/25 -gait pattern apparently chronic,exacerbated by myelopathy 2.  DVT Prophylaxis/Anticoagulation: scd/ambulating well    3. Pain Management with percocet--controlled 4. Mood: LCSW to follow for evaluation and support.  5. Neuropsych: This patient is capable of making decisions on  his own behalf. 6. Skin/Wound Care: intact. Encourage adequate nutrition 7. Fluids/Electrolytes/Nutrition:   -pushing fluids 8. HTN: Monitor BP bid--continue HCTZ and Norvasc daily.  -good control at present   Vitals:   07/29/16 0520 07/29/16 0754  BP: 130/83 112/80  Pulse: 81 78  Resp: 18   Temp: 98.2 F (36.8 C)    9. Constipation:   Senna S at bedtime daily.   10. CKD?: Avoiding nephrotoxic medications.      LOS (Days) 8 A FACE TO FACE EVALUATION WAS PERFORMED  Erick ColaceKIRSTEINS,Donise Woodle E 07/29/2016 7:54 AM

## 2016-07-30 ENCOUNTER — Inpatient Hospital Stay (HOSPITAL_COMMUNITY): Payer: Medicaid Other | Admitting: Physical Therapy

## 2016-07-30 ENCOUNTER — Encounter (HOSPITAL_COMMUNITY): Payer: Self-pay | Admitting: Neurosurgery

## 2016-07-30 ENCOUNTER — Inpatient Hospital Stay (HOSPITAL_COMMUNITY): Payer: Medicaid Other

## 2016-07-30 ENCOUNTER — Inpatient Hospital Stay (HOSPITAL_COMMUNITY): Payer: Medicaid Other | Admitting: Occupational Therapy

## 2016-07-30 MED ORDER — OXYCODONE-ACETAMINOPHEN 5-325 MG PO TABS: 1.0000 | ORAL_TABLET | Freq: Three times a day (TID) | ORAL | 0 refills | Status: DC | PRN

## 2016-07-30 MED ORDER — POLYETHYLENE GLYCOL 3350 17 G PO PACK: 17.0000 g | PACK | Freq: Three times a day (TID) | ORAL | 0 refills | Status: DC

## 2016-07-30 MED ORDER — HYDROCHLOROTHIAZIDE 25 MG PO TABS: 25.0000 mg | ORAL_TABLET | Freq: Every day | ORAL | 0 refills | Status: DC

## 2016-07-30 MED ORDER — OXYCODONE-ACETAMINOPHEN 5-325 MG PO TABS
1.0000 | ORAL_TABLET | ORAL | Status: DC | PRN
  Administered 2016-07-30 – 2016-07-31 (×3): 1 via ORAL
  Filled 2016-07-30 (×3): qty 1

## 2016-07-30 NOTE — Progress Notes (Signed)
[place] PHYSICAL MEDICINE & REHABILITATION     PROGRESS NOTE    Subjective/Complaints: No issues overnite  ROS: Pt denies fever, rash/itching, headache, blurred or double vision, nausea, vomiting, abdominal pain, diarrhea, chest pain, shortness of breath, palpitations, dysuria, dizziness,        Objective: Vital Signs: Blood pressure (!) 127/91, pulse 68, temperature 98.2 F (36.8 C), temperature source Oral, resp. rate 16, height 5\' 9"  (1.753 m), weight 79.7 kg (175 lb 12.8 oz), SpO2 100 %. No results found. No results for input(s): WBC, HGB, HCT, PLT in the last 72 hours. No results for input(s): NA, K, CL, GLUCOSE, BUN, CREATININE, CALCIUM in the last 72 hours.  Invalid input(s): CO CBG (last 3)  No results for input(s): GLUCAP in the last 72 hours.  Wt Readings from Last 3 Encounters:  07/21/16 79.7 kg (175 lb 12.8 oz)  07/13/16 78.7 kg (173 lb 6.4 oz)  06/15/16 79.8 kg (176 lb)    Physical Exam:  Constitutional: NAD  .  HENT:  Head: [place]  Mouth/Throat:  Oral mucosa pink and moist.  Eyes: EOMI Neck:  Incision clean and dry  Cardiovascular: RRR  Respiratory: clear GI: NT/ND  Musculoskeletal: slight pain with palp of right tricep area.  Neurological: He is alert and oriented to person, place, and time.  Impaired LT in feet persists Motor:  RUE: 4/5 prox to dist RLE: 4/5 prox to dist LUE: 4-/5 prox to distal LLE: 3 to 4/5 prox to distal,walks with external rotation of left hip and lateral whip of foot Skin: Skin is warm and dry. Marland Kitchen.  Psychiatric: He has a normal mood and affect. His behavior is normal.   Assessment/Plan: 1. Functional and mobility deficits secondary to cervical myelopathy which require 3+ hours per day of interdisciplinary therapy in a comprehensive inpatient rehab setting. Physiatrist is providing close team supervision and 24 hour management of active medical problems listed below. Physiatrist and rehab team continue to assess barriers to  discharge/monitor patient progress toward functional and medical goals.  Function:  Bathing Bathing position   Position: Shower  Bathing parts Body parts bathed by patient: Right arm, Left arm, Chest, Abdomen, Front perineal area, Buttocks, Right upper leg, Left upper leg, Right lower leg, Left lower leg, Back    Bathing assist Assist Level: Supervision or verbal cues      Upper Body Dressing/Undressing Upper body dressing   What is the patient wearing?: Pull over shirt/dress, Orthosis     Pull over shirt/dress - Perfomed by patient: Thread/unthread right sleeve, Thread/unthread left sleeve, Put head through opening, Pull shirt over trunk       Orthosis activity level: Performed by helper  Upper body assist Assist Level: Set up      Lower Body Dressing/Undressing Lower body dressing   What is the patient wearing?: Shoes, AFO, Pants, Socks Underwear - Performed by patient: Thread/unthread right underwear leg, Thread/unthread left underwear leg, Pull underwear up/down   Pants- Performed by patient: Thread/unthread right pants leg, Thread/unthread left pants leg, Pull pants up/down, Fasten/unfasten pants   Non-skid slipper socks- Performed by patient: Don/doff right sock, Don/doff left sock   Socks - Performed by patient: Don/doff right sock, Don/doff left sock   Shoes - Performed by patient: Don/doff right shoe, Don/doff left shoe, Fasten right, Fasten left   AFO - Performed by patient: Don/doff left AFO        Lower body assist Assist for lower body dressing: More than reasonable time  Toileting Toileting   Toileting steps completed by patient: Adjust clothing prior to toileting, Performs perineal hygiene, Adjust clothing after toileting   Toileting Assistive Devices: Grab bar or rail  Toileting assist Assist level: More than reasonable time   Transfers Chair/bed transfer   Chair/bed transfer method: Ambulatory Chair/bed transfer assist level: Supervision or  verbal cues Chair/bed transfer assistive device: Patent attorneyWalker     Locomotion Ambulation     Max distance: 90 Assist level: Touching or steadying assistance (Pt > 75%)   Wheelchair   Type: Manual Max wheelchair distance: 150 Assist Level: Supervision or verbal cues  Cognition Comprehension Comprehension assist level: Follows basic conversation/direction with extra time/assistive device  Expression Expression assist level: Expresses basic needs/ideas: With extra time/assistive device  Social Interaction Social Interaction assist level: Interacts appropriately with others - No medications needed.  Problem Solving Problem solving assist level: Solves basic 90% of the time/requires cueing < 10% of the time  Memory Memory assist level: Recognizes or recalls 75 - 89% of the time/requires cueing 10 - 24% of the time   Medical Problem List and Plan: 1.  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  -continue therapies  -AFO LLE per [facility], plan d/c on 11/25 2.  DVT Prophylaxis/Anticoagulation: scd/ambulating well    3. Pain Management with percocet--controlled 4. Mood: LCSW to follow for evaluation and support.  5. Neuropsych: This patient is capable of making decisions on  his own behalf. 6. Skin/Wound Care: intact. Encourage adequate nutrition 7. Fluids/Electrolytes/Nutrition:   -pushing fluids 8. HTN: Monitor BP bid--continue HCTZ and Norvasc daily.  -good control at present   Vitals:   07/29/16 1426 07/30/16 0547  BP: 115/79 (!) 127/91  Pulse: 87 68  Resp: 20 16  Temp: 98.6 F (37 C) 98.2 F (36.8 C)   9. Constipation:   Senna S at bedtime daily.   10. CKD?: Avoiding nephrotoxic medications.      LOS (Days) 9 A FACE TO FACE EVALUATION WAS PERFORMED  Erick ColaceKIRSTEINS,Shandell Giovanni E 07/30/2016 7:39 AM

## 2016-07-30 NOTE — Progress Notes (Signed)
Occupational Therapy Session Note  Patient Details  Name: GARL SPEIGNER MRN: [id number] Date of Birth: [birth year]  Today's Date: 07/30/2016 OT Individual Time: 1300-1330 OT Individual Time Calculation (min): 30 min     Short Term Goals:Week 1:  OT Short Term Goal 1 (Week 1): STGs = LTGs due to LOS <10 days     Skilled Therapeutic Interventions/Progress Updates:    Pt seen this session to address dynamic balance and RW management with IADLs.  Pt does not cook but does need to retrieve snacks and food from fridge and cabinets. Pt ambulated with RW to ADL apt and practiced retrieving items demonstrating good balance. Reviewed use of walker bag to carry items. Also practiced carrying laundry with RW so pt uses bag not trying to carry in his arms. Pt ambulated back to room with all needs met.   Therapy Documentation Precautions:  Precautions Precautions: Fall, Cervical Required Braces or Orthoses: Cervical Brace Cervical Brace: Hard collar (collar can be removed in bed and for showers) Restrictions Weight Bearing Restrictions: No    Vital Signs: Therapy Vitals Temp: 98.2 F (36.8 C) Temp Source: Oral Pulse Rate: 68 Resp: 16 BP: 124/84 Patient Position (if appropriate): Lying Oxygen Therapy SpO2: 100 % O2 Device: Not Delivered Pain: Pain Assessment Pain Assessment: 0-10 Pain Score: 0-No pain Pain Type: Acute pain Pain Location: Back Pain Orientation: Upper Pain Descriptors / Indicators: Aching Pain Frequency: Intermittent Pain Onset: On-going Patients Stated Pain Goal: 2 Pain Intervention(s): Medication (See eMAR);Emotional support ADL: ADL ADL Comments: Supervision overall  See Function Navigator for Current Functional Status.   Therapy/Group: Individual Therapy  SAGUIER,JULIA 07/30/2016, 9:42 AM

## 2016-07-30 NOTE — Discharge Instructions (Signed)
Inpatient Rehab Discharge Instructions  Brian Kelley Discharge date and time:  07/31/16  Activities/Precautions/ Functional Status: Activity: no lifting, driving, or strenuous exercise till cleared by MD Diet: regular diet Wound Care: keep wound clean and dry   Functional status:  ___ No restrictions     ___ Walk up steps independently ___ 24/7 supervision/assistance   ___ Walk up steps with assistance _X__ Intermittent supervision/assistance  ___ Bathe/dress independently ___ Walk with walker     _X__ Bathe/dress with assistance ___ Walk Independently    ___ Shower independently ___ Walk with assistance    ___ Shower with assistance _X__ No alcohol     ___ Return to work/school ________  COMMUNITY REFERRALS UPON DISCHARGE:   Medical Equipment/Items Ordered:  Rolling walker and bedside commode (you already received this)  Agency/Supplier:  [facility]        Phone:  [phone]  Special Instructions:    My questions have been answered and I understand these instructions. I will adhere to these goals and the provided educational materials after my discharge from the hospital.  Patient/Caregiver Signature _______________________________ Date __________  Clinician Signature _______________________________________ Date __________  Please bring this form and your medication list with you to all your follow-up doctor's appointments.

## 2016-07-30 NOTE — Progress Notes (Signed)
Physical Therapy Discharge Summary  Patient Details  Name: Brian Kelley MRN: [id number] Date of Birth: [birth year]  Today's Date: 07/30/2016   Patient has met 9 of 9 long term goals due to improved activity tolerance, improved balance, improved postural control, increased strength, ability to compensate for deficits, functional use of  right lower extremity and left lower extremity, improved awareness and improved coordination.  Patient to discharge at an ambulatory level Modified Independent.   Patient's care partner unavailable to provide the necessary physical and cognitive assistance at discharge as she works full time; available for intermittent S. Pt to discharge at [place] level for mobility and does not require hands-on assistance, however do recommend S for stairs to enter/exit home.   Reasons goals not met: All goals met  Recommendation:  Patient will benefit from ongoing skilled PT services in home health setting to continue to advance safe functional mobility, address ongoing impairments in strength, balance, coordination, and minimize fall risk. However pt does not qualify for follow up services; have instructed pt in HEP for continued balance/strengthening.   Equipment: RW  Reasons for discharge: treatment goals met and discharge from hospital  Patient/family agrees with progress made and goals achieved: Yes  PT Discharge Precautions/RestrictionsPrecautions Precautions: Fall;Cervical Required Braces or Orthoses: Cervical Brace Cervical Brace: Hard collar (collar can be removed for showers and in bed) Restrictions Weight Bearing Restrictions: No Vital Signs Therapy Vitals BP: 124/84 Patient Position (if appropriate): Lying Pain Pain Assessment Pain Assessment: No/denies pain Vision/Perception  Perception Comments: WFL  Cognition Overall Cognitive Status: Within Functional Limits for tasks assessed Arousal/Alertness: Awake/alert Orientation Level: Oriented  X4 Memory: Appears intact Safety/Judgment: Appears intact Sensation Sensation Light Touch: Impaired Detail Light Touch Impaired Details: Impaired RLE;Impaired LLE Proprioception: Impaired Detail Proprioception Impaired Details: Impaired RLE;Impaired LLE Coordination Gross Motor Movements are Fluid and Coordinated: No Heel Shin Test: impaired LLE>RLE Motor  Motor Motor: Ataxia;Tetraplegia Motor - Discharge Observations: LLE decreased coordination, generalized weakness  Mobility Bed Mobility Bed Mobility: Sit to Supine;Supine to Sit Supine to Sit: 6: Modified independent (Device/Increase time) Sit to Supine: 6: Modified independent (Device/Increase time) Transfers Transfers: Yes Stand Pivot Transfers: 6: Modified independent (Device/Increase time);With armrests Locomotion  Ambulation Ambulation: Yes Ambulation/Gait Assistance: 6: Modified independent (Device/Increase time) Ambulation Distance (Feet): 175 Feet Assistive device: Rolling walker Gait Gait: Yes Gait Pattern: Impaired Gait Pattern: Step-through pattern;Decreased dorsiflexion - left;Right genu recurvatum;Poor foot clearance - left;Trendelenburg;Decreased stance time - left;Decreased stride length;Decreased weight shift to left Gait velocity: Decreased for age/gender norms Stairs / Additional Locomotion Stairs: Yes Stairs Assistance: 5: Supervision Stairs Assistance Details: Verbal cues for precautions/safety;Verbal cues for technique Stair Management Technique: Two rails;Alternating pattern;Forwards Number of Stairs: 12 Height of Stairs: 6 Ramp: 5: Supervision Curb: 5: Supervision Wheelchair Mobility Wheelchair Mobility: No  Trunk/Postural Assessment  Cervical Assessment Cervical Assessment: Exceptions to [facility] (hard collar) Thoracic Assessment Thoracic Assessment: Within Functional Limits Lumbar Assessment Lumbar Assessment: Within Functional Limits Postural Control Postural Control: Within Functional  Limits  Balance Balance Balance Assessed: Yes Standardized Balance Assessment Standardized Balance Assessment: Berg Balance Test Berg Balance Test Sit to Stand: Able to stand  independently using hands Standing Unsupported: Able to stand safely 2 minutes Sitting with Back Unsupported but Feet Supported on Floor or Stool: Able to sit safely and securely 2 minutes Stand to Sit: Controls descent by using hands Transfers: Able to transfer safely, definite need of hands Standing Unsupported with Eyes Closed: Able to stand 10 seconds with supervision Standing Ubsupported with Feet Together: Able  to place feet together independently and stand for 1 minute with supervision From Standing, Reach Forward with Outstretched Arm: Can reach forward >5 cm safely (2") From Standing Position, Pick up Object from Floor: Able to pick up shoe, needs supervision From Standing Position, Turn to Look Behind Over each Shoulder: Turn sideways only but maintains balance Turn 360 Degrees: Needs assistance while turning Standing Unsupported, Alternately Place Feet on Step/Stool: Able to complete >2 steps/needs minimal assist Standing Unsupported, One Foot in Front: Able to take small step independently and hold 30 seconds Standing on One Leg: Tries to lift leg/unable to hold 3 seconds but remains standing independently Total Score: 34 Static Sitting Balance Static Sitting - Balance Support: Feet supported;No upper extremity supported Static Sitting - Level of Assistance: 6: Modified independent (Device/Increase time) Dynamic Sitting Balance Dynamic Sitting - Balance Support: Feet supported;No upper extremity supported Dynamic Sitting - Level of Assistance: 6: Modified independent (Device/Increase time) Static Standing Balance Static Standing - Balance Support: Bilateral upper extremity supported Static Standing - Level of Assistance: 6: Modified independent (Device/Increase time) Static Stance: Eyes closed Static  Stance: Eyes Closed: S x30 sec Dynamic Standing Balance Dynamic Standing - Balance Support: Bilateral upper extremity supported;During functional activity Dynamic Standing - Level of Assistance: 6: Modified independent (Device/Increase time) Extremity Assessment  RUE Assessment RUE Assessment: Exceptions to [facility] RUE AROM (degrees) RUE Overall AROM Comments: shoulder flexion limited to 100 degrees due to neuropathic pain; distal strength 4-/5 LUE Assessment LUE Assessment: Exceptions to WFL LUE AROM (degrees) LUE Overall AROM Comments: shoulder flexion limited to 100 degrees due to neuropathic pain; distal strength 4-/5 RLE Assessment RLE Assessment: Exceptions to [facility] RLE Strength RLE Overall Strength Comments: 4-/5 hip flexion, 4/5 knee extension, 4-/5 knee flexion, 3/5 dorsiflexion LLE Assessment LLE Assessment: Exceptions to [facility] LLE Strength LLE Overall Strength Comments: 3/5 hip flexion, 4-/5 knee flexion/extension, 1/5 dorsiflexion   See Function Navigator for Current Functional Status.  Benjiman Core Tygielski 07/30/2016, 12:22 PM

## 2016-07-30 NOTE — Progress Notes (Signed)
Occupational Therapy Session Note  Patient Details  Name: Darcel BayleyMorris D Duggan MRN: [id number] Date of Birth: [birth year]  Today's Date: 07/30/2016 OT Individual Time: 1100-1200 OT Individual Time Calculation (min): 60 min     Short Term Goals: Week 1:  OT Short Term Goal 1 (Week 1): STGs = LTGs due to LOS <10 days  Skilled Therapeutic Interventions/Progress Updates:    Pt engaged in bathing/dressing/grooming tasks is room.  Pt amb with RW to gather clothing and supplies prior to amb into bathroom to use toilet and transfer to shower.  Pt completed all bathing/dressing tasks, as well as grooming tasks at mod I level.  Pt doffs and dons cervical collar independently.  Educated pt on importance of wearing collar when not laying in bed or when taking shower.  Pt verbalized understanding.  Reeducated pt on RW safety.  Pt demonstrated safe behaviors when using his RW for BADLs.  Therapy Documentation Precautions:  Precautions Precautions: Fall, Cervical Required Braces or Orthoses: Cervical Brace Cervical Brace: Hard collar (collar can be removed for showers and in bed) Restrictions Weight Bearing Restrictions: No  See Function Navigator for Current Functional Status.   Therapy/Group: Individual Therapy  Rich BraveLanier, Malikah Lakey Chappell 07/30/2016, 12:17 PM

## 2016-07-30 NOTE — Progress Notes (Signed)
Physical Therapy Session Note  Patient Details  Name: Brian Kelley MRN: [id number] Date of Birth: 08-31-1956  Today's Date: 07/30/2016 PT Individual Time: 0930-1030 PT Individual Time Calculation (min): 60 min  and Today's Date: 07/30/2016 PT Concurrent Time: 1400-1500 PT Concurrent Time Calculation (min): 60 min    Short Term Goals: Week 1:  PT Short Term Goal 1 (Week 1): Pt will complete transfers with supervision PT Short Term Goal 2 (Week 1): Pt will be able to gait x 150' with supervision with LRAD PT Short Term Goal 3 (Week 1): Pt will demonstrate functional increase in strength in LLE (foot clearance) or determine if there is a need for bracing.   Skilled Therapeutic Interventions/Progress Updates:  Tx 1: Individual session with focus on grad day assessment and reviewing HEP. Pt denies pain and agreeable to treatment. Gait [phone] with RW and modI using LLE AFO. Car transfer performed modI with RW. Gait on ramp, uneven surface and curb step using RW and S. Performed stairs 1x12 on 6" height stairs with 2 handrails with S. Discussed recommendation with pt that he have S from family member when performing stairs; pt agreeable. Instructed pt in LE strengthening/stretching HEP with pt provided handout with written directions and pictures for performance after d/c home. Returned to room gait as above; remained seated in recliner with NT present at end of session.   Tx 2: Concurrent treatment with focus on dynamic standing balance and LE strengthening. Gait to/from gym with RW and modI. Performed lateral step ups BLE 2x10 on 6" stair with B handrails. Standing hip extension and hip abduction 2x10 in parallel bars. Dynamic standing balance while engaged in zoom ball for BUE strengthening and endurance. Assessed Sharlene MottsBerg; patient demonstrates increased fall risk as noted by score of  34/56 on Berg Balance Scale.  (<36= high risk for falls, close to 100%; 37-45 significant >80%; 46-51 moderate >50%;  52-55 lower >25%). However significant improvement over baseline score 22/56. Patient with no further questions/concerns regarding d/c at this time. Pt returned to room with gait as above; remained seated on EOB at end of session, all needs in reach.    Therapy Documentation Precautions:  Precautions Precautions: Fall, Cervical Required Braces or Orthoses: Cervical Brace Cervical Brace: Hard collar (collar can be removed in bed and for showers) Restrictions Weight Bearing Restrictions: No   See Function Navigator for Current Functional Status.   Therapy/Group: Individual Therapy and Concurrent  Vista Lawmanlizabeth J Tygielski 07/30/2016, 12:08 PM

## 2016-07-30 NOTE — Progress Notes (Signed)
Occupational Therapy Discharge Summary  Patient Details  Name: Brian Kelley MRN: [id number] Date of Birth: [birth year]   Patient has met 9 of 9 long term goals due to improved activity tolerance, improved balance, ability to compensate for deficits and improved coordination.  Pt made steady progress with BADLs and IADLs during this admission.  Pt is mod I with all BADLs and laundry tasks.  Pt doffs and dons his cervical collar independently.  Patient to discharge at overall Modified Independent level.  Patient's care partner is independent to provide the necessary physical assistance at discharge.     Recommendation:  No further OT services required at this time.  Equipment: BSC, shower seat  Reasons for discharge: treatment goals met  Patient/family agrees with progress made and goals achieved: Yes  OT Discharge Vision/Perception  Vision- History Baseline Vision/History: Wears glasses Wears Glasses: Reading only Patient Visual Report: No change from baseline Vision- Assessment Vision Assessment?: No apparent visual deficits Perception Comments: WFL  Cognition Overall Cognitive Status: Within Functional Limits for tasks assessed Arousal/Alertness: Awake/alert Orientation Level: Oriented X4 Memory: Appears intact Safety/Judgment: Appears intact Sensation Sensation Light Touch: Impaired Detail Light Touch Impaired Details: Impaired RLE;Impaired LLE Stereognosis: Appears Intact Hot/Cold: Appears Intact Proprioception: Impaired Detail Proprioception Impaired Details: Impaired RLE;Impaired LLE Coordination Gross Motor Movements are Fluid and Coordinated: Yes (BUE) Fine Motor Movements are Fluid and Coordinated: Yes Heel Shin Test: impaired LLE>RLE Motor  Motor Motor: Ataxia;Tetraplegia Motor - Discharge Observations: LLE decreased coordination, generalized weakness Mobility  Bed Mobility Bed Mobility: Sit to Supine;Supine to Sit Supine to Sit: 6: Modified independent  (Device/Increase time) Sit to Supine: 6: Modified independent (Device/Increase time)  Trunk/Postural Assessment  Cervical Assessment Cervical Assessment: Exceptions to [facility] (hard collar) Thoracic Assessment Thoracic Assessment: Within Functional Limits Lumbar Assessment Lumbar Assessment: Within Functional Limits Postural Control Postural Control: Within Functional Limits  Balance Balance Balance Assessed: Yes Static Sitting Balance Static Sitting - Balance Support: Feet supported;No upper extremity supported Static Sitting - Level of Assistance: 7: Independent Dynamic Sitting Balance Dynamic Sitting - Balance Support: Feet supported;No upper extremity supported Dynamic Sitting - Level of Assistance: 6: Modified independent (Device/Increase time) Static Standing Balance Static Standing - Balance Support: Bilateral upper extremity supported Static Standing - Level of Assistance: 6: Modified independent (Device/Increase time) Static Stance: Eyes closed Static Stance: Eyes Closed: S x30 sec Dynamic Standing Balance Dynamic Standing - Balance Support: Bilateral upper extremity supported;During functional activity Dynamic Standing - Level of Assistance: 6: Modified independent (Device/Increase time) Extremity/Trunk Assessment RUE Assessment RUE Assessment: Exceptions to [facility] RUE AROM (degrees) RUE Overall AROM Comments: shoulder flexion limited to 100 degrees due to neuropathic pain; distal strength 4-/5 LUE Assessment LUE Assessment: Exceptions to WFL LUE AROM (degrees) LUE Overall AROM Comments: shoulder flexion limited to 100 degrees due to neuropathic pain; distal strength 4-/5   See Function Navigator for Current Functional Status.  Leotis Shames [facility] 07/30/2016, 12:30 PM

## 2016-07-30 NOTE — Discharge Summary (Signed)
Physician Discharge Summary  Patient ID: Brian Kelley MRN: [id number] DOB/AGE: May 17, 1956 60 y.o.  Admit date: 07/21/2016 Discharge date: 07/31/2016  Discharge Diagnoses:  Principal Problem:   Cervical disc disorder with myelopathy, cervicothoracic region Active Problems:   Benign essential HTN   Stage 3 chronic kidney disease   Discharged Condition:  Stable   Significant Diagnostic Studies:   Labs:  Basic Metabolic Panel: BMP Latest Ref Rng & Units 07/26/2016 07/22/2016 07/13/2016  Glucose 65 - 99 mg/dL 89 91 76  BUN 6 - 20 mg/dL 40(J21(H) 16 13  Creatinine 0.61 - 1.24 mg/dL 8.111.22 9.14(N1.45(H) 8.29(F1.33(H)  Sodium 135 - 145 mmol/L 133(L) 136 138  Potassium 3.5 - 5.1 mmol/L 4.1 3.9 3.8  Chloride 101 - 111 mmol/L 98(L) 98(L) 102  CO2 22 - 32 mmol/L 26 32 26  Calcium 8.9 - 10.3 mg/dL 9.7 9.4 62.110.3    CBC: CBC Latest Ref Rng & Units 07/22/2016 07/13/2016 04/30/2016  WBC 4.0 - 10.5 K/uL 7.0 5.4 5.8  Hemoglobin 13.0 - 17.0 g/dL 30.813.3 65.715.9 84.615.8  Hematocrit 39.0 - 52.0 % 40.2 47.2 47.4  Platelets 150 - 400 K/uL 237 246 242    CBG: No results for input(s): GLUCAP in the last 168 hours.  Brief HPI:   Brian HummingbirdMorris D Wadeis a 60 y.o.malewith history of HTN, ETOH/marijuana abuse,  6-7 months of numbness RUE/RLE and right body numbness with LLE weakness. MRI cervical spine revealed cervical stenosis worse at C7-T1 with hazy edema secondary to early myelomalacia and flattening of cord at C6/C7 due to moderate to severe narrowing. He elected to undergo ACDF C5-T1 by Dr. Lovell SheehanJenkins on 07/19/16. PT/OT evaluations done revealing unsteady gait with balance deficits. CIR recommended for follow up therapy   Hospital Course: Brian Kelley was admitted to rehab 07/21/2016 for inpatient therapies to consist of PT and OT at least three hours five days a week. Past admission physiatrist, therapy team and rehab RN have worked together to provide customized collaborative inpatient rehab. Blood pressures were relatively  controlled on HCTZ and Norvasc daily. He was encouraged to increase fluid intake with improvement in renal status. Po intake has been good and he is continent of bowel and bladder. Anterior neck incision is intact and is healing well without signs or symptoms of infection. He was fitted with L-AFO to help with foot drop and improve gait quality. He made great progress during his rehab stay and was modified independent at discharge.  He has been educated in HEP as no follow up therapy covered under medicaid.     Rehab course: During patient's stay in rehab weekly team conferences were held to monitor patient's progress, set goals and discuss barriers to discharge. At admission, patient required moderate assistance with mobility and supervision with basic self care tasks. He has had improvement in activity tolerance, balance, postural control, as well as ability to compensate for deficits. He is able to complete ADL tasks at modified independent level and no unsafe behaviors noted. He is able to don and doff his collar independently. He is independent for transfers and is ambulating 175' x 2 with RW. He requires supervision to  Navigate stairs.   Disposition: 01-Home or Self Care  Diet: Regular.   Special Instructions: 1. No driving. Lifting items over 5 lbs or strenuous activity. 2. Attempt to wean oxycodone to every 8-12 hours prn.   Discharge Instructions    Ambulatory referral to Physical Medicine Rehab    Complete by:  As directed  1-2 week follow up       Medication List    STOP taking these medications   docusate sodium 100 MG capsule Commonly known as:  COLACE     TAKE these medications   albuterol 108 (90 Base) MCG/ACT inhaler Commonly known as:  PROVENTIL HFA;VENTOLIN HFA Inhale 2 puffs into the lungs every 6 (six) hours as needed for wheezing or shortness of breath.   amLODipine 10 MG tablet Commonly known as:  NORVASC Take 10 mg by mouth daily.   aspirin EC 325 MG  tablet Take 325 mg by mouth daily.   cyclobenzaprine 10 MG tablet Commonly known as:  FLEXERIL Take 1 tablet (10 mg total) by mouth 3 (three) times daily as needed for muscle spasms.   hydrochlorothiazide 25 MG tablet Commonly known as:  HYDRODIURIL Take 1 tablet (25 mg total) by mouth daily.   oxyCODONE-acetaminophen 5-325 MG tablet--Rx 21  Pills  Commonly known as:  PERCOCET/ROXICET Take 1 tablet by mouth every 8 (eight) hours as needed for severe pain. What changed:  how much to take  when to take this  reasons to take this   polyethylene glycol packet Commonly known as:  MIRALAX / GLYCOLAX Take 17 g by mouth 3 (three) times daily before meals.       Follow-up Information    Ranelle OysterSWARTZ,ZACHARY T, MD Follow up.   Specialty:  Physical Medicine and Rehabilitation Why:  Office will call  you with follow up appointment Contact information: [address] [place] [address] [phone]        Cristi LoronJENKINS,JEFFREY D, MD. Call in 1 day(s).   Specialty:  Neurosurgery Why:  for follow up appointment in 2-3 weeks. Contact information: [address] [place] [address] [phone]        Presley RaddleAdrian Mancheno, MD Follow up on 08/09/2016.   Specialty:  Family Medicine Why:  @ 10:40 AM Contact information: [address] [place] [address] [phone]           Signed: Jacquelynn CreeLove, Pamela S 08/02/2016, 12:02 PM

## 2016-07-31 DIAGNOSIS — G8918 Other acute postprocedural pain: Secondary | ICD-10-CM

## 2016-07-31 NOTE — Progress Notes (Signed)
[facility] [facility] PHYSICAL MEDICINE & REHABILITATION     PROGRESS NOTE    Subjective/Complaints: Pt laying in bed this AM.  He states he will be ready to go home around 12PM.  ROS: Denies CP, SOB, N/V/D  Objective: Vital Signs: Blood pressure 106/68, pulse 76, temperature 98.5 F (36.9 C), temperature source Oral, resp. rate 18, height $RemoveBeforeD [id number]$5\' 9"100 %. No results found. No results for input(s): WBC, HGB, HCT, PLT in the last 72 hours. No results for input(s): NA, K, CL, GLUCOSE, BUN, CREATININE, CALCIUM in the last 72 hours.  Invalid input(s): CO CBG (last 3)  No results for input(s): GLUCAP in the last 72 hours.  Wt Readings from Last 3 Encounters:  07/21/16 79.7 kg (175 lb 12.8 oz)  07/13/16 78.7 kg (173 lb 6.4 oz)  06/15/16 79.8 kg (176 lb)    Physical Exam:  Constitutional: NAD. Vital signs reviewed.  HENT: [place]. AT Mouth/Throat:  Oral mucosa pink and moist.  Eyes: EOMI Cardiovascular: RRR. No JVD. Respiratory: clear. Unlabored GI: NT/ND  Musculoskeletal: No edema, tenderness Neurological: He is alert and oriented.  Motor:  RUE: 4/5 prox to dist RLE: 4/5 prox to dist LUE: 4-/5 prox to distal LLE: 3 to 4/5 prox to distal Skin: Skin is warm and dry. Marland Kitchen.  Psychiatric: He has a normal mood and affect. His behavior is normal.   Assessment/Plan: 1. Functional and mobility deficits secondary to cervical myelopathy which require 3+ hours per day of interdisciplinary therapy in a comprehensive inpatient rehab setting. Physiatrist is providing close team supervision and 24 hour management of active medical problems listed below. Physiatrist and rehab team continue to assess barriers to discharge/monitor patient progress toward functional and medical goals.  Function:  Bathing Bathing position   Position: Shower  Bathing parts Body parts bathed by patient: Right arm, Left arm, Chest, Abdomen, Front perineal area, Buttocks, Right  upper leg, Left upper leg, Right lower leg, Left lower leg, Back    Bathing assist Assist Level: More than reasonable time      Upper Body Dressing/Undressing Upper body dressing   What is the patient wearing?: Pull over shirt/dress, Orthosis     Pull over shirt/dress - Perfomed by patient: Thread/unthread right sleeve, Thread/unthread left sleeve, Put head through opening, Pull shirt over trunk       Orthosis activity level: Performed by patient  Upper body assist Assist Level: More than reasonable time      Lower Body Dressing/Undressing Lower body dressing   What is the patient wearing?: Underwear, Pants, Socks, Shoes, AFO Underwear - Performed by patient: Thread/unthread right underwear leg, Thread/unthread left underwear leg, Pull underwear up/down   Pants- Performed by patient: Thread/unthread right pants leg, Thread/unthread left pants leg, Pull pants up/down, Fasten/unfasten pants   Non-skid slipper socks- Performed by patient: Don/doff right sock, Don/doff left sock   Socks - Performed by patient: Don/doff right sock, Don/doff left sock   Shoes - Performed by patient: Don/doff right shoe, Don/doff left shoe, Fasten right, Fasten left   AFO - Performed by patient: Don/doff left AFO        Lower body assist Assist for lower body dressing: More than reasonable time, Assistive device      Toileting Toileting   Toileting steps completed by patient: Adjust clothing prior to toileting, Performs perineal hygiene, Adjust clothing after toileting   Toileting Assistive Devices: Grab bar or rail  Toileting assist Assist level: More  than reasonable time   Transfers Chair/bed transfer   Chair/bed transfer method: Ambulatory Chair/bed transfer assist level: No Help, no cues, assistive device, takes more than a reasonable amount of time Chair/bed transfer assistive device: Patent attorneyWalker     Locomotion Ambulation     Max distance: 180 Assist level: No help, No cues, assistive  device, takes more than a reasonable amount of time   Wheelchair   Type: Manual Max wheelchair distance: 150 Assist Level: Supervision or verbal cues  Cognition Comprehension Comprehension assist level: Follows basic conversation/direction with extra time/assistive device  Expression Expression assist level: Expresses basic needs/ideas: With extra time/assistive device  Social Interaction Social Interaction assist level: Interacts appropriately with others - No medications needed.  Problem Solving Problem solving assist level: Solves basic 90% of the time/requires cueing < 10% of the time  Memory Memory assist level: Recognizes or recalls 75 - 89% of the time/requires cueing 10 - 24% of the time   Medical Problem List and Plan: 1.  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  D/c today 2.  DVT Prophylaxis/Anticoagulation: scd/ambulating well 3. Pain Management with percocet--controlled 4. Mood: LCSW to follow for evaluation and support.  5. Neuropsych: This patient is capable of making decisions on  his own behalf. 6. Skin/Wound Care: intact. Encourage adequate nutrition 7. Fluids/Electrolytes/Nutrition:   -pushing fluids 8. HTN: Monitor BP bid--continue HCTZ and Norvasc daily.  -good control at present   Vitals:   07/30/16 1510 07/31/16 0540  BP: (!) 139/95 106/68  Pulse: 85 76  Resp: 16 18  Temp: 97.7 F (36.5 C) 98.5 F (36.9 C)   9. Constipation:   Senna S at bedtime daily.   10. CKD?: Avoiding nephrotoxic medications.      LOS (Days) 10 A FACE TO FACE EVALUATION WAS PERFORMED  Nahshon Reich Karis Jubanil Jalayla Chrismer 07/31/2016 8:12 AM

## 2016-07-31 NOTE — Progress Notes (Signed)
Alert and oriented.  Medications/appointments reviewed and patient allowed time to ask questions.  All belongings gathered and transported to exit via wheelchair to private car with driver.

## 2016-08-02 NOTE — Progress Notes (Signed)
Social Work Discharge Note  The overall goal for the admission was met for:   Discharge location: Yes - home to sister's home  Length of Stay: Yes - 10 days  Discharge activity level: Yes - modified independent   Home/community participation: Yes  Services provided included: MD, RD, PT, OT, RN, Pharmacy and SW  Financial Services: Medicaid  Follow-up services arranged: DME: bedside commode (delievered while on acute) and rolling walker from [place] and Patient/Family has no preference for HH/DME agencies; [facility] for community case management  Comments (or additional information): pt to stay with his sister while he recovers.  He's hopeful to move into his mother's home when he has the finances to do so.  Patient/Family verbalized understanding of follow-up arrangements: Yes  Individual responsible for coordination of the follow-up plan: pt  Confirmed correct DME delivered: Trey Sailors 08/02/2016    Tasheika Kitzmiller, Silvestre Mesi

## 2016-08-10 ENCOUNTER — Encounter: Payer: Medicaid Other | Admitting: Physical Medicine & Rehabilitation

## 2016-08-10 ENCOUNTER — Encounter: Payer: Medicaid Other | Attending: Physical Medicine & Rehabilitation | Admitting: Physical Medicine & Rehabilitation

## 2016-09-01 ENCOUNTER — Encounter (HOSPITAL_COMMUNITY): Payer: Self-pay | Admitting: Emergency Medicine

## 2016-09-01 ENCOUNTER — Emergency Department (HOSPITAL_COMMUNITY): Payer: Medicaid Other

## 2016-09-01 ENCOUNTER — Emergency Department (HOSPITAL_COMMUNITY)
Admission: EM | Admit: 2016-09-01 | Discharge: 2016-09-01 | Disposition: A | Discharge status: Home / Self Care | Payer: Medicaid Other | Attending: Emergency Medicine | Admitting: Emergency Medicine

## 2016-09-01 DIAGNOSIS — I10 Essential (primary) hypertension: Secondary | ICD-10-CM

## 2016-09-01 DIAGNOSIS — M545 Low back pain: Secondary | ICD-10-CM | POA: Diagnosis not present

## 2016-09-01 DIAGNOSIS — I129 Hypertensive chronic kidney disease with stage 1 through stage 4 chronic kidney disease, or unspecified chronic kidney disease: Secondary | ICD-10-CM | POA: Insufficient documentation

## 2016-09-01 DIAGNOSIS — Z91010 Allergy to peanuts: Secondary | ICD-10-CM | POA: Insufficient documentation

## 2016-09-01 DIAGNOSIS — W010XXA Fall on same level from slipping, tripping and stumbling without subsequent striking against object, initial encounter: Secondary | ICD-10-CM | POA: Diagnosis not present

## 2016-09-01 DIAGNOSIS — J45909 Unspecified asthma, uncomplicated: Secondary | ICD-10-CM | POA: Diagnosis not present

## 2016-09-01 DIAGNOSIS — F17210 Nicotine dependence, cigarettes, uncomplicated: Secondary | ICD-10-CM | POA: Insufficient documentation

## 2016-09-01 DIAGNOSIS — M549 Dorsalgia, unspecified: Secondary | ICD-10-CM

## 2016-09-01 DIAGNOSIS — Y939 Activity, unspecified: Secondary | ICD-10-CM | POA: Diagnosis not present

## 2016-09-01 DIAGNOSIS — W19XXXA Unspecified fall, initial encounter: Secondary | ICD-10-CM

## 2016-09-01 DIAGNOSIS — N183 Chronic kidney disease, stage 3 (moderate): Secondary | ICD-10-CM | POA: Diagnosis not present

## 2016-09-01 DIAGNOSIS — Z79899 Other long term (current) drug therapy: Secondary | ICD-10-CM | POA: Insufficient documentation

## 2016-09-01 DIAGNOSIS — Z7982 Long term (current) use of aspirin: Secondary | ICD-10-CM | POA: Diagnosis not present

## 2016-09-01 DIAGNOSIS — M546 Pain in thoracic spine: Secondary | ICD-10-CM | POA: Insufficient documentation

## 2016-09-01 DIAGNOSIS — Y929 Unspecified place or not applicable: Secondary | ICD-10-CM | POA: Diagnosis not present

## 2016-09-01 DIAGNOSIS — Y999 Unspecified external cause status: Secondary | ICD-10-CM | POA: Insufficient documentation

## 2016-09-01 MED ORDER — OXYCODONE-ACETAMINOPHEN 5-325 MG PO TABS
1.0000 | ORAL_TABLET | Freq: Once | ORAL | Status: AC
  Administered 2016-09-01: 1 via ORAL
  Filled 2016-09-01: qty 1

## 2016-09-01 MED ORDER — AMLODIPINE BESYLATE 5 MG PO TABS
10.0000 mg | ORAL_TABLET | Freq: Once | ORAL | Status: AC
  Administered 2016-09-01: 10 mg via ORAL
  Filled 2016-09-01: qty 2

## 2016-09-01 MED ORDER — HYDROCHLOROTHIAZIDE 25 MG PO TABS
25.0000 mg | ORAL_TABLET | Freq: Once | ORAL | Status: AC
  Administered 2016-09-01: 25 mg via ORAL
  Filled 2016-09-01: qty 1

## 2016-09-01 MED ORDER — HYDROCHLOROTHIAZIDE 25 MG PO TABS: 25.0000 mg | ORAL_TABLET | Freq: Every day | ORAL | 0 refills | Status: DC

## 2016-09-01 MED ORDER — AMLODIPINE BESYLATE 10 MG PO TABS: 10.0000 mg | ORAL_TABLET | Freq: Every day | ORAL | 0 refills | Status: DC
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
## 2016-09-01 NOTE — Discharge Instructions (Signed)
X-rays today are normal. We have refilled your blood pressure medication. Please continue taking this. Please follow-up with your neurosurgeon for a refill of your pain medicine. Return to the emergency department for any new or worsening symptoms.

## 2016-09-01 NOTE — ED Notes (Signed)
Pt returned to room and placed back on monitor.  

## 2016-09-01 NOTE — ED Notes (Signed)
Pt transported to xray 

## 2016-09-01 NOTE — ED Triage Notes (Signed)
Pt sts fall last night from slip and fall; pt sts mid back pain and wants to get checked because he recently had sx

## 2016-09-01 NOTE — ED Notes (Signed)
Hooked patient back up to the monitor after xray 

## 2016-09-01 NOTE — ED Notes (Addendum)
States his legs were weak last night and he fell last night ,  Just fell on his back , he was outside may have hit his head. Just  Had  Neck surgery on the 13 of nov and he  Wants to be checked out , he is at the shelter now he states

## 2016-09-03 ENCOUNTER — Encounter: Payer: Self-pay | Admitting: Pediatric Intensive Care

## 2016-09-03 MED FILL — HYDROCHLOROTHIAZIDE 25 MG T: 25 | 30 days supply | Qty: 30 | Fill #0

## 2016-09-03 MED FILL — AMLODIPINE BESYLATE 10 MG T: 10 | 30 days supply | Qty: 30 | Fill #0

## 2016-09-10 ENCOUNTER — Encounter: Payer: Self-pay | Admitting: Pediatric Intensive Care
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
## 2016-09-13 ENCOUNTER — Encounter: Payer: Self-pay | Admitting: Pediatric Intensive Care

## 2016-09-21 ENCOUNTER — Encounter: Payer: Medicaid Other | Attending: Physical Medicine & Rehabilitation | Admitting: Physical Medicine & Rehabilitation

## 2016-09-21 ENCOUNTER — Encounter: Payer: Self-pay | Admitting: Physical Medicine & Rehabilitation

## 2016-09-21 VITALS — BP 114/82 | HR 87

## 2016-09-21 DIAGNOSIS — M4712 Other spondylosis with myelopathy, cervical region: Secondary | ICD-10-CM

## 2016-09-21 DIAGNOSIS — N183 Chronic kidney disease, stage 3 unspecified: Secondary | ICD-10-CM

## 2016-09-21 DIAGNOSIS — I10 Essential (primary) hypertension: Secondary | ICD-10-CM | POA: Diagnosis present

## 2016-09-21 DIAGNOSIS — M4722 Other spondylosis with radiculopathy, cervical region: Secondary | ICD-10-CM | POA: Diagnosis not present

## 2016-09-21 MED ORDER — HYDROCHLOROTHIAZIDE 25 MG PO TABS: 25.0000 mg | ORAL_TABLET | Freq: Every day | ORAL | 2 refills | Status: AC

## 2016-09-21 MED ORDER — AMLODIPINE BESYLATE 10 MG PO TABS: 10.0000 mg | ORAL_TABLET | Freq: Every day | ORAL | 2 refills | Status: AC

## 2016-09-21 MED ORDER — CYCLOBENZAPRINE HCL 10 MG PO TABS: 10.0000 mg | ORAL_TABLET | Freq: Three times a day (TID) | ORAL | 2 refills | Status: DC | PRN

## 2016-09-21 NOTE — Patient Instructions (Signed)
PLEASE FEEL FREE TO CALL OUR OFFICE WITH ANY PROBLEMS OR QUESTIONS ([phone])      
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
## 2016-09-29 MED FILL — AMLODIPINE BESYLATE 10 MG T: 10 | 30 days supply | Qty: 30 | Fill #0

## 2016-09-29 MED FILL — HYDROCHLOROTHIAZIDE 25 MG T: 25 | 30 days supply | Qty: 30 | Fill #0

## 2016-09-29 MED FILL — CYCLOBENZAPRINE 10 MG TAB: 10 | 16 days supply | Qty: 50 | Fill #0
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
## 2016-10-11 ENCOUNTER — Emergency Department (HOSPITAL_COMMUNITY)
Admission: EM | Admit: 2016-10-11 | Discharge: 2016-10-11 | Disposition: A | Discharge status: Home / Self Care | Payer: Medicaid Other | Attending: Emergency Medicine | Admitting: Emergency Medicine

## 2016-10-11 ENCOUNTER — Emergency Department (HOSPITAL_COMMUNITY): Payer: Medicaid Other

## 2016-10-11 ENCOUNTER — Encounter (HOSPITAL_COMMUNITY): Payer: Self-pay | Admitting: Emergency Medicine

## 2016-10-11 DIAGNOSIS — S82831A Other fracture of upper and lower end of right fibula, initial encounter for closed fracture: Secondary | ICD-10-CM

## 2016-10-11 DIAGNOSIS — I10 Essential (primary) hypertension: Secondary | ICD-10-CM | POA: Diagnosis not present

## 2016-10-11 DIAGNOSIS — F17210 Nicotine dependence, cigarettes, uncomplicated: Secondary | ICD-10-CM | POA: Insufficient documentation

## 2016-10-11 DIAGNOSIS — W010XXA Fall on same level from slipping, tripping and stumbling without subsequent striking against object, initial encounter: Secondary | ICD-10-CM | POA: Diagnosis not present

## 2016-10-11 DIAGNOSIS — S82891A Other fracture of right lower leg, initial encounter for closed fracture: Secondary | ICD-10-CM | POA: Insufficient documentation

## 2016-10-11 DIAGNOSIS — Z7982 Long term (current) use of aspirin: Secondary | ICD-10-CM | POA: Insufficient documentation

## 2016-10-11 DIAGNOSIS — Z91010 Allergy to peanuts: Secondary | ICD-10-CM | POA: Insufficient documentation

## 2016-10-11 DIAGNOSIS — Y999 Unspecified external cause status: Secondary | ICD-10-CM | POA: Diagnosis not present

## 2016-10-11 DIAGNOSIS — Z79899 Other long term (current) drug therapy: Secondary | ICD-10-CM | POA: Insufficient documentation

## 2016-10-11 DIAGNOSIS — S82431A Displaced oblique fracture of shaft of right fibula, initial encounter for closed fracture: Secondary | ICD-10-CM | POA: Diagnosis not present

## 2016-10-11 DIAGNOSIS — J45909 Unspecified asthma, uncomplicated: Secondary | ICD-10-CM | POA: Insufficient documentation

## 2016-10-11 DIAGNOSIS — Y9289 Other specified places as the place of occurrence of the external cause: Secondary | ICD-10-CM | POA: Diagnosis not present

## 2016-10-11 DIAGNOSIS — Y939 Activity, unspecified: Secondary | ICD-10-CM | POA: Diagnosis not present

## 2016-10-11 DIAGNOSIS — S8991XA Unspecified injury of right lower leg, initial encounter: Secondary | ICD-10-CM | POA: Diagnosis present

## 2016-10-11 MED ORDER — IBUPROFEN 200 MG PO TABS
600.0000 mg | ORAL_TABLET | Freq: Once | ORAL | Status: AC
  Administered 2016-10-11: 600 mg via ORAL
  Filled 2016-10-11: qty 3

## 2016-10-11 MED ORDER — NAPROXEN 500 MG PO TABS: 500.0000 mg | ORAL_TABLET | Freq: Two times a day (BID) | ORAL | 0 refills | Status: DC | PRN

## 2016-10-11 MED ORDER — HYDROCODONE-ACETAMINOPHEN 5-325 MG PO TABS: 1.0000 | ORAL_TABLET | Freq: Four times a day (QID) | ORAL | 0 refills | Status: DC | PRN

## 2016-10-11 NOTE — ED Notes (Signed)
Pt reports he is having trouble bearing weight on legs.

## 2016-10-11 NOTE — ED Triage Notes (Signed)
Per [facility] for falling on piece of plastic on side walk. Patient reports that leg came up under him and has right numbness in right ankle.  Patient was ambulatory at seen. Patient uses cane for ambulation assistance.
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
## 2016-10-11 NOTE — Discharge Instructions (Signed)
Wear ankle splint until you see the orthopedist. Use crutches for all weight bearing activities. Ice and elevate ankle throughout the day, using ice pack for no more than 20 minutes every hour.  Alternate between naprosyn and norco for pain relief. Do not drive or operate machinery with pain medication use. Call orthopedic follow up today or tomorrow to schedule followup appointment for ongoing management of your ankle fracture in the next 5-7 days. Return to the ER for changes or worsening symptoms.

## 2016-10-18 ENCOUNTER — Emergency Department (HOSPITAL_COMMUNITY)
Admission: EM | Admit: 2016-10-18 | Discharge: 2016-10-18 | Disposition: A | Discharge status: Home / Self Care | Payer: Medicaid Other | Attending: Emergency Medicine | Admitting: Emergency Medicine

## 2016-10-18 DIAGNOSIS — R200 Anesthesia of skin: Secondary | ICD-10-CM | POA: Diagnosis not present

## 2016-10-18 DIAGNOSIS — N183 Chronic kidney disease, stage 3 (moderate): Secondary | ICD-10-CM | POA: Insufficient documentation

## 2016-10-18 DIAGNOSIS — Z7982 Long term (current) use of aspirin: Secondary | ICD-10-CM | POA: Insufficient documentation

## 2016-10-18 DIAGNOSIS — F17210 Nicotine dependence, cigarettes, uncomplicated: Secondary | ICD-10-CM | POA: Diagnosis not present

## 2016-10-18 DIAGNOSIS — I129 Hypertensive chronic kidney disease with stage 1 through stage 4 chronic kidney disease, or unspecified chronic kidney disease: Secondary | ICD-10-CM | POA: Diagnosis not present

## 2016-10-18 DIAGNOSIS — Z79899 Other long term (current) drug therapy: Secondary | ICD-10-CM | POA: Insufficient documentation

## 2016-10-18 DIAGNOSIS — J45909 Unspecified asthma, uncomplicated: Secondary | ICD-10-CM | POA: Insufficient documentation

## 2016-10-18 DIAGNOSIS — Z91010 Allergy to peanuts: Secondary | ICD-10-CM | POA: Diagnosis not present

## 2016-10-18 MED ORDER — ACETAMINOPHEN 325 MG PO TABS
650.0000 mg | ORAL_TABLET | Freq: Once | ORAL | Status: AC
  Administered 2016-10-18: 650 mg via ORAL
  Filled 2016-10-18: qty 2

## 2016-10-18 NOTE — ED Notes (Signed)
Bed: WA08 Expected date:  Expected time:  Means of arrival: Ambulance Comments: EMS ankle pain
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
## 2016-10-18 NOTE — ED Triage Notes (Addendum)
Pt fell and broke right ankle 1 week ago, now c/o numbness and soreness x 3 days with ambulation or using crutches. Pain decreased when supine, numbness persists. Soft cast and boot to right leg.   Right foot hot, edematous. Pedal pulse 2+ with doppler. Motor function and sensation intact.  Hx bilateral leg numbness corrected with disk surgery in November.

## 2016-10-18 NOTE — Discharge Instructions (Signed)
Keep your appointment with the orthopedic surgeon that you have scheduled. Continue to rest with her leg elevated. Use Tylenol and/or Motrin as directed for discomfort. Return here if you develop symptoms of numbness or tingling when you are at rest

## 2016-10-19 ENCOUNTER — Encounter: Payer: Self-pay | Admitting: Pediatric Intensive Care

## 2016-10-19 MED FILL — HYDROCODON-APAP 5-325: 5-325 | 3 days supply | Qty: 10 | Fill #0

## 2016-10-19 MED FILL — NAPROXEN 500 MG TABLET: 500 | 10 days supply | Qty: 20 | Fill #0
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
## 2016-10-22 ENCOUNTER — Encounter: Payer: Self-pay | Admitting: Pediatric Intensive Care

## 2016-10-27 ENCOUNTER — Ambulatory Visit (INDEPENDENT_AMBULATORY_CARE_PROVIDER_SITE_OTHER): Payer: Self-pay

## 2016-10-27 ENCOUNTER — Ambulatory Visit (INDEPENDENT_AMBULATORY_CARE_PROVIDER_SITE_OTHER): Payer: Self-pay | Admitting: Orthopedic Surgery

## 2016-10-27 DIAGNOSIS — S82831A Other fracture of upper and lower end of right fibula, initial encounter for closed fracture: Secondary | ICD-10-CM | POA: Insufficient documentation

## 2016-10-27 MED ORDER — HYDROCODONE-ACETAMINOPHEN 5-325 MG PO TABS: 1.0000 | ORAL_TABLET | Freq: Four times a day (QID) | ORAL | 0 refills | Status: DC | PRN

## 2016-10-27 MED ORDER — NAPROXEN 500 MG PO TABS: 500.0000 mg | ORAL_TABLET | Freq: Two times a day (BID) | ORAL | 0 refills | Status: DC | PRN
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
## 2016-10-29 ENCOUNTER — Encounter: Payer: Self-pay | Admitting: Pediatric Intensive Care

## 2016-10-29 MED FILL — NAPROXEN 500 MG TABLET: 500 | 20 days supply | Qty: 40 | Fill #0

## 2016-11-01 ENCOUNTER — Encounter: Payer: Self-pay | Admitting: Pediatric Intensive Care
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
## 2016-11-25 ENCOUNTER — Encounter (INDEPENDENT_AMBULATORY_CARE_PROVIDER_SITE_OTHER): Payer: Self-pay | Admitting: Orthopedic Surgery

## 2016-11-25 ENCOUNTER — Ambulatory Visit (INDEPENDENT_AMBULATORY_CARE_PROVIDER_SITE_OTHER): Payer: Medicaid Other

## 2016-11-25 ENCOUNTER — Ambulatory Visit (INDEPENDENT_AMBULATORY_CARE_PROVIDER_SITE_OTHER): Payer: Medicaid Other | Admitting: Orthopedic Surgery

## 2016-11-25 DIAGNOSIS — S82831D Other fracture of upper and lower end of right fibula, subsequent encounter for closed fracture with routine healing: Secondary | ICD-10-CM | POA: Diagnosis not present

## 2016-11-25 MED ORDER — NAPROXEN 500 MG PO TABS
500.0000 mg | ORAL_TABLET | Freq: Two times a day (BID) | ORAL | 0 refills | Status: DC | PRN
Start: 2016-11-25 — End: 2017-02-14
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
## 2016-11-26 MED FILL — NAPROXEN 500 MG TABLET: 500 | 20 days supply | Qty: 40 | Fill #0

## 2016-12-09 ENCOUNTER — Ambulatory Visit (INDEPENDENT_AMBULATORY_CARE_PROVIDER_SITE_OTHER): Payer: Medicaid Other | Admitting: Orthopedic Surgery

## 2016-12-09 ENCOUNTER — Ambulatory Visit (INDEPENDENT_AMBULATORY_CARE_PROVIDER_SITE_OTHER): Payer: Medicaid Other

## 2016-12-09 ENCOUNTER — Encounter (INDEPENDENT_AMBULATORY_CARE_PROVIDER_SITE_OTHER): Payer: Self-pay | Admitting: Orthopedic Surgery

## 2016-12-09 DIAGNOSIS — S82831D Other fracture of upper and lower end of right fibula, subsequent encounter for closed fracture with routine healing: Secondary | ICD-10-CM
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
## 2016-12-20 ENCOUNTER — Encounter: Payer: Self-pay | Admitting: Physical Medicine & Rehabilitation

## 2016-12-20 ENCOUNTER — Encounter: Payer: Medicaid Other | Attending: Physical Medicine & Rehabilitation | Admitting: Physical Medicine & Rehabilitation

## 2016-12-20 VITALS — BP 145/99 | HR 83

## 2016-12-20 DIAGNOSIS — M4722 Other spondylosis with radiculopathy, cervical region: Secondary | ICD-10-CM | POA: Insufficient documentation

## 2016-12-20 DIAGNOSIS — S82821S Torus fracture of lower end of right fibula, sequela: Secondary | ICD-10-CM | POA: Diagnosis not present

## 2016-12-20 DIAGNOSIS — M5003 Cervical disc disorder with myelopathy, cervicothoracic region: Secondary | ICD-10-CM | POA: Diagnosis not present

## 2016-12-20 DIAGNOSIS — N183 Chronic kidney disease, stage 3 (moderate): Secondary | ICD-10-CM | POA: Diagnosis present

## 2016-12-20 DIAGNOSIS — M4712 Other spondylosis with myelopathy, cervical region: Secondary | ICD-10-CM | POA: Diagnosis not present

## 2016-12-20 DIAGNOSIS — J4530 Mild persistent asthma, uncomplicated: Secondary | ICD-10-CM | POA: Diagnosis not present

## 2016-12-20 DIAGNOSIS — I10 Essential (primary) hypertension: Secondary | ICD-10-CM | POA: Diagnosis present

## 2016-12-20 MED ORDER — ALBUTEROL SULFATE HFA 108 (90 BASE) MCG/ACT IN AERS: 2.0000 | INHALATION_SPRAY | Freq: Four times a day (QID) | RESPIRATORY_TRACT | 4 refills | Status: AC | PRN
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
## 2016-12-20 NOTE — Patient Instructions (Signed)
CONTINUE TO WORK ON IMPROVING THE POSITIONING OF YOUR LEFT LEG WHEN YOU WALK.

## 2017-02-10 ENCOUNTER — Emergency Department (HOSPITAL_COMMUNITY)
Admission: EM | Admit: 2017-02-10 | Discharge: 2017-02-10 | Disposition: A | Discharge status: Home / Self Care | Payer: Medicaid Other | Attending: Emergency Medicine | Admitting: Emergency Medicine

## 2017-02-10 ENCOUNTER — Emergency Department (HOSPITAL_COMMUNITY): Payer: Medicaid Other

## 2017-02-10 ENCOUNTER — Encounter (HOSPITAL_COMMUNITY): Payer: Self-pay | Admitting: Emergency Medicine

## 2017-02-10 DIAGNOSIS — Z79899 Other long term (current) drug therapy: Secondary | ICD-10-CM | POA: Insufficient documentation

## 2017-02-10 DIAGNOSIS — Z7982 Long term (current) use of aspirin: Secondary | ICD-10-CM | POA: Diagnosis not present

## 2017-02-10 DIAGNOSIS — I129 Hypertensive chronic kidney disease with stage 1 through stage 4 chronic kidney disease, or unspecified chronic kidney disease: Secondary | ICD-10-CM | POA: Diagnosis not present

## 2017-02-10 DIAGNOSIS — M542 Cervicalgia: Secondary | ICD-10-CM | POA: Diagnosis present

## 2017-02-10 DIAGNOSIS — J45909 Unspecified asthma, uncomplicated: Secondary | ICD-10-CM | POA: Insufficient documentation

## 2017-02-10 DIAGNOSIS — Z91010 Allergy to peanuts: Secondary | ICD-10-CM | POA: Insufficient documentation

## 2017-02-10 DIAGNOSIS — N183 Chronic kidney disease, stage 3 (moderate): Secondary | ICD-10-CM | POA: Diagnosis not present

## 2017-02-10 DIAGNOSIS — M545 Low back pain, unspecified: Secondary | ICD-10-CM

## 2017-02-10 DIAGNOSIS — F17210 Nicotine dependence, cigarettes, uncomplicated: Secondary | ICD-10-CM | POA: Insufficient documentation

## 2017-02-10 MED ORDER — OXYCODONE-ACETAMINOPHEN 5-325 MG PO TABS
1.0000 | ORAL_TABLET | Freq: Once | ORAL | Status: AC
  Administered 2017-02-10: 1 via ORAL
  Filled 2017-02-10: qty 1

## 2017-02-10 MED ORDER — HYDROCODONE-ACETAMINOPHEN 5-325 MG PO TABS: 1.0000 | ORAL_TABLET | ORAL | 0 refills | Status: DC | PRN

## 2017-02-10 MED ORDER — METHOCARBAMOL 500 MG PO TABS
500.0000 mg | ORAL_TABLET | Freq: Once | ORAL | Status: AC
  Administered 2017-02-10: 500 mg via ORAL
  Filled 2017-02-10: qty 1

## 2017-02-10 MED ORDER — PREDNISONE 20 MG PO TABS: ORAL_TABLET | ORAL | 0 refills | Status: DC

## 2017-02-10 NOTE — ED Triage Notes (Signed)
Pt reports he had disc surgery on his back in November, states yesterday started having bad lower back pain and neck pain. States pain is gone when he sits but returns when he tries to stand, is unable to stand up straight.

## 2017-02-10 NOTE — Discharge Instructions (Signed)
As we discussed, your x-rays did show some degenerative changes which were there before but may be a bit worse now. Take the prescribed medication as directed.  Do not drive while taking the pain medication as it can make you sleepy/drowsy. Follow-up with Dr. Lovell SheehanJenkins.  You can follow-up with your primary care doctor in the interim. Return to the ED for new or worsening symptoms.
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
## 2017-02-14 ENCOUNTER — Emergency Department (HOSPITAL_COMMUNITY)
Admission: EM | Admit: 2017-02-14 | Discharge: 2017-02-14 | Disposition: A | Discharge status: Home / Self Care | Payer: Medicaid Other | Attending: Emergency Medicine | Admitting: Emergency Medicine

## 2017-02-14 ENCOUNTER — Encounter (HOSPITAL_COMMUNITY): Payer: Self-pay | Admitting: *Deleted

## 2017-02-14 DIAGNOSIS — S39012A Strain of muscle, fascia and tendon of lower back, initial encounter: Secondary | ICD-10-CM | POA: Diagnosis not present

## 2017-02-14 DIAGNOSIS — X58XXXA Exposure to other specified factors, initial encounter: Secondary | ICD-10-CM | POA: Insufficient documentation

## 2017-02-14 DIAGNOSIS — Y929 Unspecified place or not applicable: Secondary | ICD-10-CM | POA: Diagnosis not present

## 2017-02-14 DIAGNOSIS — Z91010 Allergy to peanuts: Secondary | ICD-10-CM | POA: Insufficient documentation

## 2017-02-14 DIAGNOSIS — F17200 Nicotine dependence, unspecified, uncomplicated: Secondary | ICD-10-CM | POA: Diagnosis not present

## 2017-02-14 DIAGNOSIS — M545 Low back pain, unspecified: Secondary | ICD-10-CM

## 2017-02-14 DIAGNOSIS — I129 Hypertensive chronic kidney disease with stage 1 through stage 4 chronic kidney disease, or unspecified chronic kidney disease: Secondary | ICD-10-CM | POA: Diagnosis not present

## 2017-02-14 DIAGNOSIS — J45909 Unspecified asthma, uncomplicated: Secondary | ICD-10-CM | POA: Insufficient documentation

## 2017-02-14 DIAGNOSIS — N183 Chronic kidney disease, stage 3 (moderate): Secondary | ICD-10-CM | POA: Diagnosis not present

## 2017-02-14 DIAGNOSIS — S3992XA Unspecified injury of lower back, initial encounter: Secondary | ICD-10-CM | POA: Diagnosis present

## 2017-02-14 DIAGNOSIS — Y9389 Activity, other specified: Secondary | ICD-10-CM | POA: Insufficient documentation

## 2017-02-14 DIAGNOSIS — Y999 Unspecified external cause status: Secondary | ICD-10-CM | POA: Diagnosis not present

## 2017-02-14 MED ORDER — METHOCARBAMOL 500 MG PO TABS: 500.0000 mg | ORAL_TABLET | Freq: Two times a day (BID) | ORAL | 0 refills | Status: DC

## 2017-02-14 MED ORDER — NAPROXEN 500 MG PO TABS: 500.0000 mg | ORAL_TABLET | Freq: Two times a day (BID) | ORAL | 0 refills | Status: DC

## 2017-02-14 MED ORDER — TRAMADOL HCL 50 MG PO TABS: 50.0000 mg | ORAL_TABLET | Freq: Four times a day (QID) | ORAL | 0 refills | Status: DC | PRN

## 2017-02-14 MED ORDER — OXYCODONE-ACETAMINOPHEN 5-325 MG PO TABS
1.0000 | ORAL_TABLET | Freq: Once | ORAL | Status: AC
  Administered 2017-02-14: 1 via ORAL
  Filled 2017-02-14: qty 1

## 2017-02-14 NOTE — ED Notes (Signed)
See provider's note for assessment.
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
## 2017-02-14 NOTE — Discharge Instructions (Signed)
Please read and follow all provided instructions.  Your diagnoses today include:  1. Acute left-sided low back pain without sciatica   2. Strain of lumbar paraspinous muscle, initial encounter     Tests performed today include: Vital signs - see below for your results today  Medications prescribed:   Take any prescribed medications only as directed.  Home care instructions:  Follow any educational materials contained in this packet Please rest, use ice or heat on your back for the next several days Do not lift, push, pull anything more than 10 pounds for the next week  Follow-up instructions: Please follow-up with your primary care provider in the next 1 week for further evaluation of your symptoms.   Return instructions:  SEEK IMMEDIATE MEDICAL ATTENTION IF YOU HAVE: New numbness, tingling, weakness, or problem with the use of your arms or legs Severe back pain not relieved with medications Loss control of your bowels or bladder Increasing pain in any areas of the body (such as chest or abdominal pain) Shortness of breath, dizziness, or fainting.  Worsening nausea (feeling sick to your stomach), vomiting, fever, or sweats Any other emergent concerns regarding your health   Additional Information:  Your vital signs today were: BP (!) 143/111 (BP Location: Right Arm)    Pulse 81    Temp 98.5 F (36.9 C) (Oral)    Resp 18    SpO2 100%  If your blood pressure (BP) was elevated above 135/85 this visit, please have this repeated by your doctor within one month. --------------

## 2017-02-14 NOTE — ED Triage Notes (Signed)
Pt is here ambulating with cane and had previous back surgery and then started having left lower back pain on Thursday and seen here sent home on pain medication, then pain went away on Saturday and then came back yesterday

## 2017-03-21 ENCOUNTER — Encounter: Payer: Medicaid Other | Admitting: Physical Medicine & Rehabilitation

## 2017-03-28 ENCOUNTER — Encounter: Payer: Medicaid Other | Attending: Physical Medicine & Rehabilitation | Admitting: Physical Medicine & Rehabilitation

## 2017-05-11 ENCOUNTER — Encounter: Payer: Medicaid Other | Attending: Physical Medicine & Rehabilitation | Admitting: Physical Medicine & Rehabilitation

## 2017-05-18 ENCOUNTER — Telehealth: Payer: Self-pay | Admitting: *Deleted

## 2017-05-18 NOTE — Telephone Encounter (Signed)
Letter written

## 2017-05-24 ENCOUNTER — Emergency Department (HOSPITAL_COMMUNITY)
Admission: EM | Admit: 2017-05-24 | Discharge: 2017-05-24 | Disposition: A | Discharge status: Home / Self Care | Payer: Medicaid Other | Attending: Emergency Medicine | Admitting: Emergency Medicine

## 2017-05-24 ENCOUNTER — Emergency Department (HOSPITAL_COMMUNITY): Payer: Medicaid Other

## 2017-05-24 ENCOUNTER — Encounter (HOSPITAL_COMMUNITY): Payer: Self-pay | Admitting: *Deleted

## 2017-05-24 DIAGNOSIS — J45909 Unspecified asthma, uncomplicated: Secondary | ICD-10-CM | POA: Diagnosis not present

## 2017-05-24 DIAGNOSIS — M545 Low back pain: Secondary | ICD-10-CM | POA: Diagnosis present

## 2017-05-24 DIAGNOSIS — M5441 Lumbago with sciatica, right side: Secondary | ICD-10-CM | POA: Insufficient documentation

## 2017-05-24 DIAGNOSIS — Z79899 Other long term (current) drug therapy: Secondary | ICD-10-CM | POA: Insufficient documentation

## 2017-05-24 DIAGNOSIS — F17210 Nicotine dependence, cigarettes, uncomplicated: Secondary | ICD-10-CM | POA: Diagnosis not present

## 2017-05-24 DIAGNOSIS — I129 Hypertensive chronic kidney disease with stage 1 through stage 4 chronic kidney disease, or unspecified chronic kidney disease: Secondary | ICD-10-CM | POA: Diagnosis not present

## 2017-05-24 DIAGNOSIS — M5431 Sciatica, right side: Secondary | ICD-10-CM

## 2017-05-24 DIAGNOSIS — N183 Chronic kidney disease, stage 3 (moderate): Secondary | ICD-10-CM | POA: Insufficient documentation

## 2017-05-24 MED ORDER — OXYCODONE-ACETAMINOPHEN 5-325 MG PO TABS: 1.0000 | ORAL_TABLET | Freq: Three times a day (TID) | ORAL | 0 refills | Status: DC | PRN

## 2017-05-24 MED ORDER — AMLODIPINE BESYLATE 5 MG PO TABS
10.0000 mg | ORAL_TABLET | Freq: Once | ORAL | Status: AC
  Administered 2017-05-24: 10 mg via ORAL
  Filled 2017-05-24: qty 2

## 2017-05-24 MED ORDER — METHYLPREDNISOLONE 4 MG PO TBPK: ORAL_TABLET | ORAL | 0 refills | Status: DC

## 2017-05-24 MED ORDER — OXYCODONE-ACETAMINOPHEN 5-325 MG PO TABS
1.0000 | ORAL_TABLET | Freq: Once | ORAL | Status: AC
  Administered 2017-05-24: 1 via ORAL
  Filled 2017-05-24: qty 1
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
## 2017-05-24 NOTE — ED Triage Notes (Signed)
To ED for eval right leg pain. Pain starts in buttocks and radiates down right leg. Walking with a cane due to his leg 'giving out'. No trouble with urination or defication.

## 2017-05-24 NOTE — ED Notes (Signed)
Pt to xray

## 2017-05-24 NOTE — Discharge Instructions (Signed)
Please read attached information regarding your condition. Take steroids tapered dose as directed. Take Percocet as needed for severe pain. Follow-up with her PCP for further evaluation. Return to ED for worsening pain, injury, falls, loss of bladder function,severe back pain, vomiting, lightheadedness or loss of consciousness.

## 2017-06-03 ENCOUNTER — Encounter (HOSPITAL_COMMUNITY): Payer: Self-pay

## 2017-06-03 ENCOUNTER — Emergency Department (HOSPITAL_COMMUNITY)
Admission: EM | Admit: 2017-06-03 | Discharge: 2017-06-03 | Disposition: A | Discharge status: Home / Self Care | Payer: Medicaid Other | Attending: Emergency Medicine | Admitting: Emergency Medicine

## 2017-06-03 DIAGNOSIS — M545 Low back pain: Secondary | ICD-10-CM | POA: Diagnosis present

## 2017-06-03 DIAGNOSIS — J45909 Unspecified asthma, uncomplicated: Secondary | ICD-10-CM | POA: Insufficient documentation

## 2017-06-03 DIAGNOSIS — M5441 Lumbago with sciatica, right side: Secondary | ICD-10-CM | POA: Diagnosis not present

## 2017-06-03 DIAGNOSIS — F17210 Nicotine dependence, cigarettes, uncomplicated: Secondary | ICD-10-CM | POA: Diagnosis not present

## 2017-06-03 DIAGNOSIS — I129 Hypertensive chronic kidney disease with stage 1 through stage 4 chronic kidney disease, or unspecified chronic kidney disease: Secondary | ICD-10-CM | POA: Diagnosis not present

## 2017-06-03 DIAGNOSIS — Z79899 Other long term (current) drug therapy: Secondary | ICD-10-CM | POA: Insufficient documentation

## 2017-06-03 DIAGNOSIS — N183 Chronic kidney disease, stage 3 (moderate): Secondary | ICD-10-CM | POA: Diagnosis not present

## 2017-06-03 DIAGNOSIS — G8929 Other chronic pain: Secondary | ICD-10-CM | POA: Insufficient documentation

## 2017-06-03 MED ORDER — KETOROLAC TROMETHAMINE 60 MG/2ML IM SOLN
60.0000 mg | Freq: Once | INTRAMUSCULAR | Status: AC
  Administered 2017-06-03: 60 mg via INTRAMUSCULAR
  Filled 2017-06-03: qty 2

## 2017-06-03 MED ORDER — METHYLPREDNISOLONE SODIUM SUCC 125 MG IJ SOLR
125.0000 mg | Freq: Once | INTRAMUSCULAR | Status: AC
  Administered 2017-06-03: 125 mg via INTRAMUSCULAR
  Filled 2017-06-03: qty 2

## 2017-06-03 MED ORDER — DICLOFENAC SODIUM 50 MG PO TBEC: 50.0000 mg | DELAYED_RELEASE_TABLET | Freq: Two times a day (BID) | ORAL | 0 refills | Status: AC

## 2017-06-03 MED ORDER — METHOCARBAMOL 500 MG PO TABS: 500.0000 mg | ORAL_TABLET | Freq: Two times a day (BID) | ORAL | 0 refills | Status: AC
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
## 2017-06-03 NOTE — ED Triage Notes (Signed)
Per EMS- Patient is a resident of a rehab facility. Patient c/o low back pain and staff assisted the patient and was ambulatory at the scene with assistance. Patient was seen for the same 10 days ago. Patient states he was given a prescriptin for Percocet and now has ran out.

## 2019-03-28 IMAGING — CR DG LUMBAR SPINE COMPLETE 4+V
5 series · 5 of 5 positions shown · non-contrast
Comparison: Radiographs February 10, 2017.

CLINICAL DATA: Buttocks and right leg pain without known injury.

EXAM:
LUMBAR SPINE - COMPLETE 4+ VIEW

[l-spine ap]
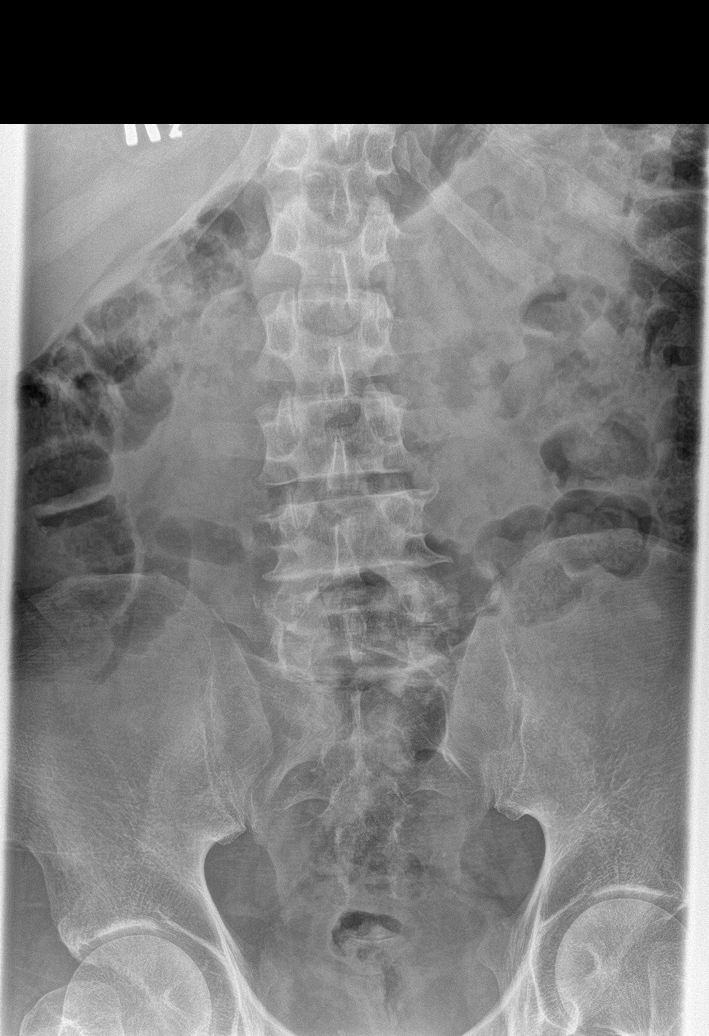

[l-spine obl (1 of 2)]
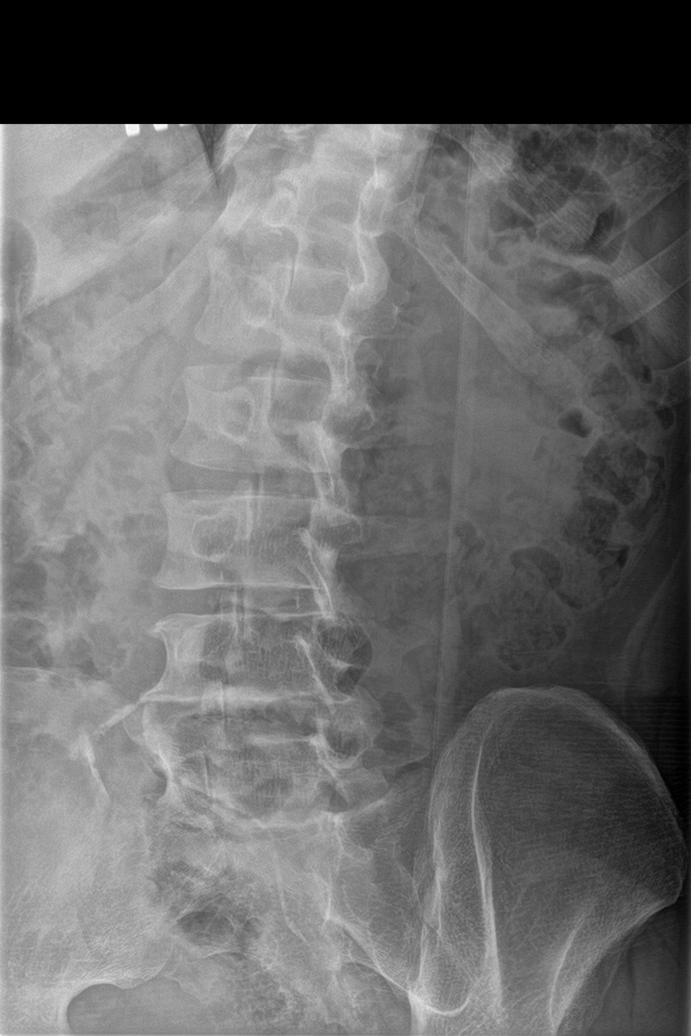

[l-spine obl (2 of 2)]
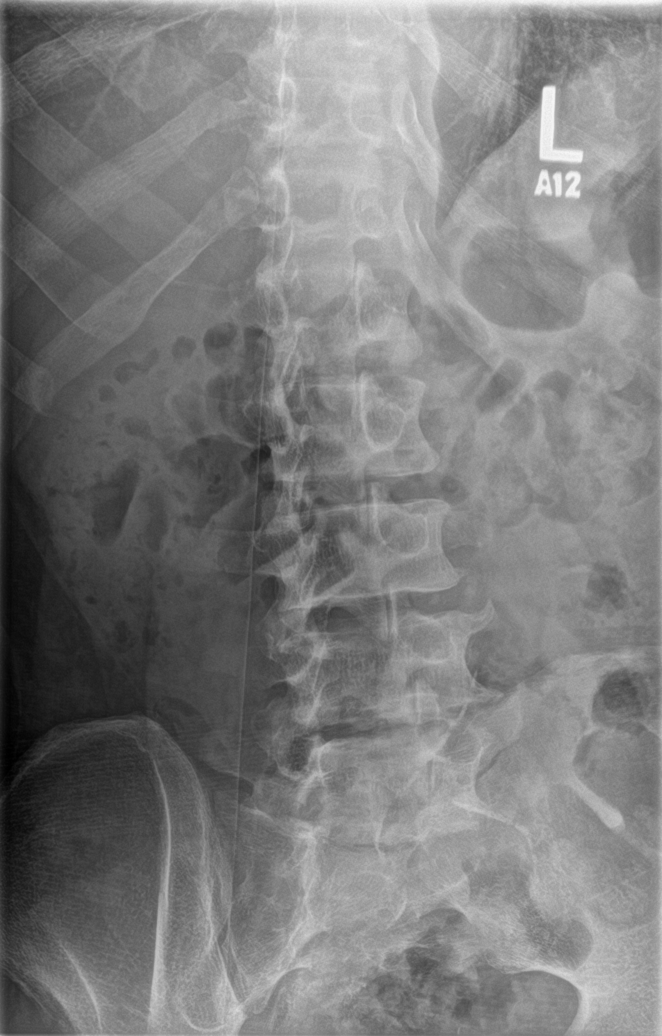

[l-spine lat]
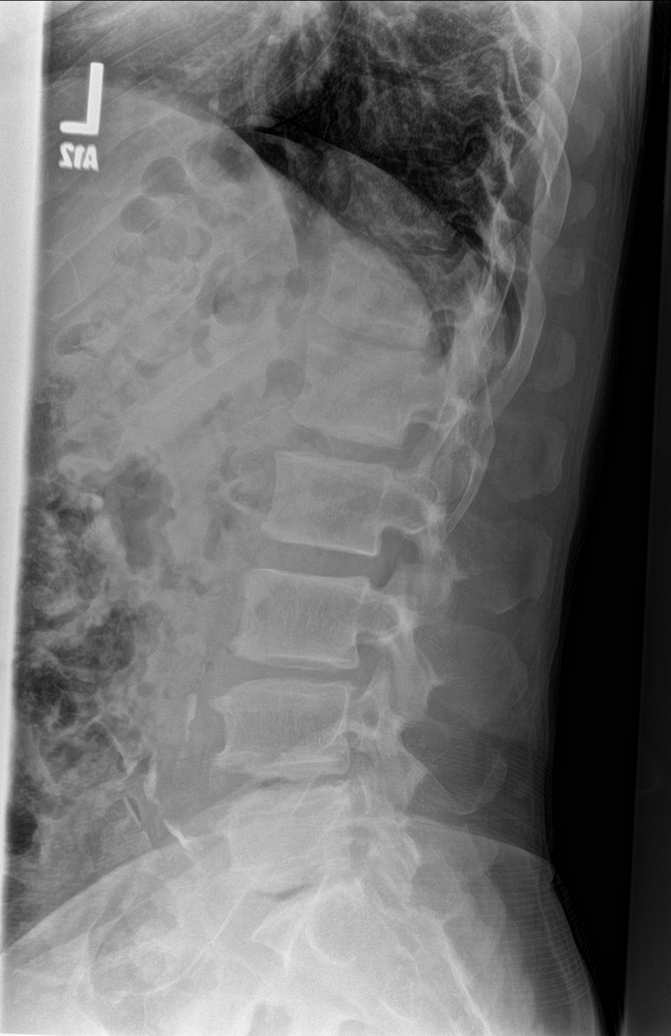

[l-spine spot]
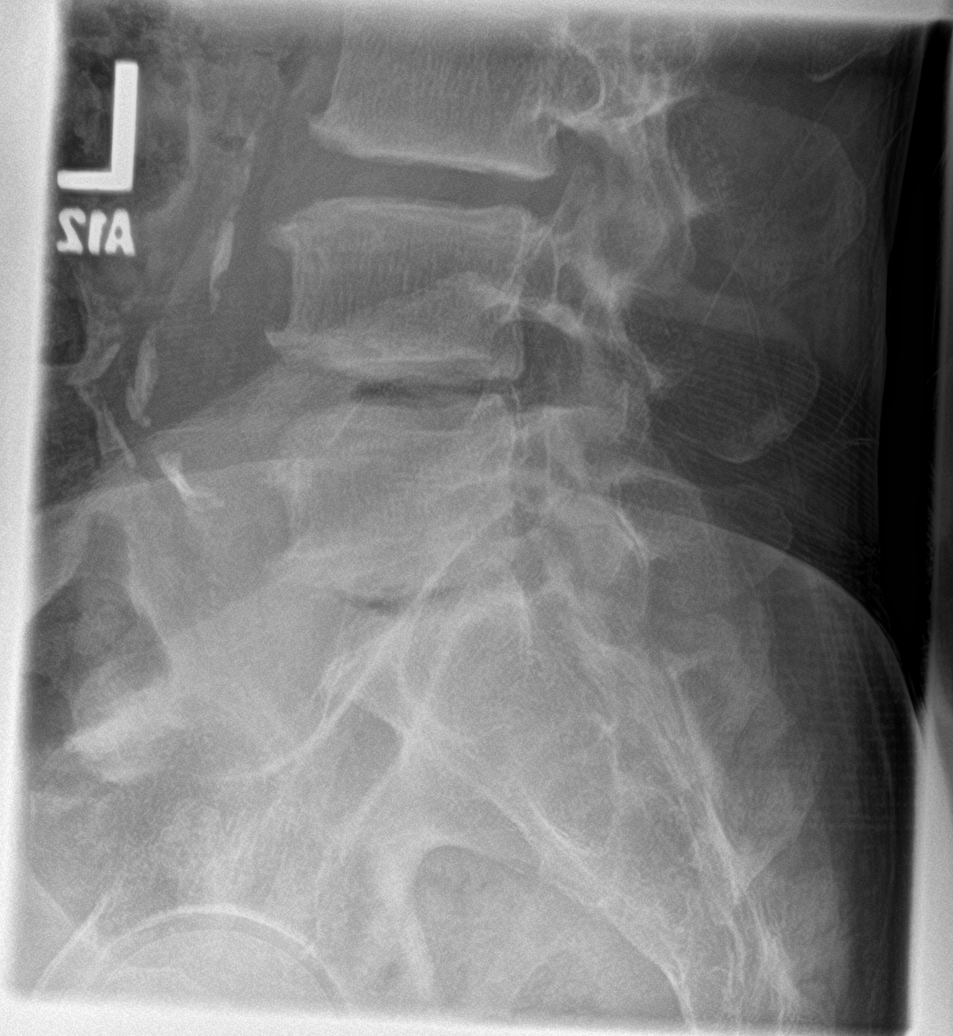

[5 of 5 positions shown; findings below may reference images not displayed]

FINDINGS: No fracture or spondylolisthesis is noted. Moderate degenerative
disc disease is noted at L3-4, L4-5 and L5-S1 with anterior
osteophyte formation. Atherosclerosis of abdominal aorta is noted.
IMPRESSION: Moderate multilevel degenerative disc disease. No acute abnormality
seen in the lumbar spine.

## 2021-05-26 ENCOUNTER — Other Ambulatory Visit: Payer: Self-pay

## 2021-05-26 ENCOUNTER — Emergency Department (HOSPITAL_COMMUNITY)
Admission: EM | Admit: 2021-05-26 | Discharge: 2021-05-26 | Disposition: A | Discharge status: Home / Self Care | Payer: Medicaid Other | Attending: Emergency Medicine | Admitting: Emergency Medicine

## 2021-05-26 DIAGNOSIS — Y900 Blood alcohol level of less than 20 mg/100 ml: Secondary | ICD-10-CM | POA: Diagnosis not present

## 2021-05-26 DIAGNOSIS — F17210 Nicotine dependence, cigarettes, uncomplicated: Secondary | ICD-10-CM | POA: Diagnosis not present

## 2021-05-26 DIAGNOSIS — N183 Chronic kidney disease, stage 3 unspecified: Secondary | ICD-10-CM | POA: Insufficient documentation

## 2021-05-26 DIAGNOSIS — I129 Hypertensive chronic kidney disease with stage 1 through stage 4 chronic kidney disease, or unspecified chronic kidney disease: Secondary | ICD-10-CM | POA: Diagnosis not present

## 2021-05-26 DIAGNOSIS — F1490 Cocaine use, unspecified, uncomplicated: Secondary | ICD-10-CM | POA: Diagnosis not present

## 2021-05-26 DIAGNOSIS — Z79899 Other long term (current) drug therapy: Secondary | ICD-10-CM | POA: Insufficient documentation

## 2021-05-26 DIAGNOSIS — Z91010 Allergy to peanuts: Secondary | ICD-10-CM | POA: Diagnosis not present

## 2021-05-26 DIAGNOSIS — F1020 Alcohol dependence, uncomplicated: Secondary | ICD-10-CM | POA: Diagnosis not present

## 2021-05-26 DIAGNOSIS — J45909 Unspecified asthma, uncomplicated: Secondary | ICD-10-CM | POA: Insufficient documentation

## 2021-05-26 LAB — RAPID URINE DRUG SCREEN, HOSP PERFORMED
Amphetamines: NOT DETECTED
Barbiturates: NOT DETECTED
Benzodiazepines: NOT DETECTED
Cocaine: POSITIVE — AB
Opiates: NOT DETECTED
Tetrahydrocannabinol: NOT DETECTED

## 2021-05-26 LAB — CBC
HCT: 48.3 % (ref 39.0–52.0)
Hemoglobin: 15.2 g/dL (ref 13.0–17.0)
MCH: 24.3 pg — ABNORMAL LOW (ref 26.0–34.0)
MCHC: 31.5 g/dL (ref 30.0–36.0)
MCV: 77.2 fL — ABNORMAL LOW (ref 80.0–100.0)
Platelets: 283 10*3/uL (ref 150–400)
RBC: 6.26 MIL/uL — ABNORMAL HIGH (ref 4.22–5.81)
RDW: 15 % (ref 11.5–15.5)
WBC: 5.3 10*3/uL (ref 4.0–10.5)
nRBC: 0 % (ref 0.0–0.2)

## 2021-05-26 LAB — COMPREHENSIVE METABOLIC PANEL
ALT: 17 U/L (ref 0–44)
AST: 23 U/L (ref 15–41)
Albumin: 4 g/dL (ref 3.5–5.0)
Alkaline Phosphatase: 53 U/L (ref 38–126)
Anion gap: 10 (ref 5–15)
BUN: 18 mg/dL (ref 8–23)
CO2: 25 mmol/L (ref 22–32)
Calcium: 9.7 mg/dL (ref 8.9–10.3)
Chloride: 102 mmol/L (ref 98–111)
Creatinine, Ser: 1.54 mg/dL — ABNORMAL HIGH (ref 0.61–1.24)
GFR, Estimated: 50 mL/min — ABNORMAL LOW (ref >60)
Glucose, Bld: 72 mg/dL (ref 70–99)
Potassium: 4.5 mmol/L (ref 3.5–5.1)
Sodium: 137 mmol/L (ref 135–145)
Total Bilirubin: 0.8 mg/dL (ref 0.3–1.2)
Total Protein: 7.7 g/dL (ref 6.5–8.1)

## 2021-05-26 LAB — ETHANOL: Alcohol, Ethyl (B): <10 mg/dL (ref <10)
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
## 2021-05-26 NOTE — Discharge Instructions (Addendum)
Your history exam and work-up today were overall reassuring aside from some mildly with your kidney function.  Please rest and stay hydrated.  We spoke with the case management/social work team who helped secure a place for you to be monitored for the next 72 hours as you appear medically clear at this time so that you can go to the inpatient alcohol treatment facility afterwards.  If any symptoms change or worsen or you start having more significant withdrawal symptoms, please return to the nearest emergency department.

## 2021-05-26 NOTE — ED Notes (Signed)
Patient verbalizes understanding of discharge instructions. Opportunity for questioning and answers were provided. Armband removed by staff, pt discharged from ED ambulatory. Pt given info of [employer].

## 2021-05-26 NOTE — ED Provider Notes (Signed)
Emergency Medicine Provider Triage Evaluation Note  Brian Kelley , a 65 y.o. male  was evaluated in triage.  Pt presents wanting to detox from alcohol. Last drink 2-3 hrs prior to arrival. Went to [facility] for the same last night and was told they didn't have any beds so was sent here. No hx of seizures with detox. No other complaints.   Review of Systems  Positive: headache Negative: CP, SOB  Physical Exam  BP (!) 166/117   Pulse 82   Resp 16   SpO2 99%  Gen:   Awake, no distress   Resp:  Normal effort  MSK:   Moves extremities without difficulty  Other:    Medical Decision Making  Medically screening exam initiated at 2:55 PM.  Appropriate orders placed.  Brian Kelley was informed that the remainder of the evaluation will be completed by another provider, this initial triage assessment does not replace that evaluation, and the importance of remaining in the ED until their evaluation is complete.     Brian Kelley 05/26/21 1459    Brian Sleeper, MD 05/26/21 [phone]

## 2021-05-26 NOTE — ED Triage Notes (Signed)
Pt wanting to detox from alcohol. Last drink 2-3 hours PTA. Went to [facility] for same last night but reports they didn't have any beds so was sent here.

## 2021-05-26 NOTE — Care Management (Signed)
ED RN Care Manager met with patient to discuss detox, patient reports that he called [facility] about ETOH/ issue was instructed to detox, and sent to [facility] ED. Explained that we do not have a detox program at [facility], but there is [facility] detox program in [place] which may have a bed. Patient is agreeable, contacted DM at [phone]  was instructed to tell patient to go around to back door in the urgent care area for intake.  Arranged Melburn Popper ride for transport to [employer].  Updated Randall Hiss ED RN.
